# Patient Record
Sex: Female | Born: 1935 | Hispanic: No | Marital: Single | Smoking: Never smoker
Health system: Southern US, Community
[De-identification: ages and names within clinical notes are randomized; demographics above are authoritative.]

## PROBLEM LIST (undated history)

## (undated) DIAGNOSIS — J449 Chronic obstructive pulmonary disease, unspecified: Secondary | ICD-10-CM

## (undated) DIAGNOSIS — I10 Essential (primary) hypertension: Secondary | ICD-10-CM

## (undated) DIAGNOSIS — F0390 Unspecified dementia without behavioral disturbance: Secondary | ICD-10-CM

## (undated) DIAGNOSIS — F0150 Vascular dementia without behavioral disturbance: Secondary | ICD-10-CM

## (undated) HISTORY — DX: Essential (primary) hypertension: I10

## (undated) HISTORY — DX: Chronic obstructive pulmonary disease, unspecified: J44.9

## (undated) HISTORY — PX: TOOTH EXTRACTION: SUR596

---

## 2002-12-10 ENCOUNTER — Other Ambulatory Visit: Admission: RE | Admit: 2002-12-10 | Discharge: 2002-12-10 | Payer: Self-pay | Admitting: Unknown Physician Specialty

## 2019-05-29 DIAGNOSIS — E559 Vitamin D deficiency, unspecified: Secondary | ICD-10-CM | POA: Diagnosis not present

## 2019-05-29 DIAGNOSIS — Z0001 Encounter for general adult medical examination with abnormal findings: Secondary | ICD-10-CM | POA: Diagnosis not present

## 2019-05-29 DIAGNOSIS — E782 Mixed hyperlipidemia: Secondary | ICD-10-CM | POA: Diagnosis not present

## 2019-05-29 DIAGNOSIS — M316 Other giant cell arteritis: Secondary | ICD-10-CM | POA: Diagnosis not present

## 2019-05-29 DIAGNOSIS — I10 Essential (primary) hypertension: Secondary | ICD-10-CM | POA: Diagnosis not present

## 2019-05-29 DIAGNOSIS — J4530 Mild persistent asthma, uncomplicated: Secondary | ICD-10-CM | POA: Diagnosis not present

## 2019-06-27 DIAGNOSIS — Z66 Do not resuscitate: Secondary | ICD-10-CM | POA: Diagnosis not present

## 2019-10-15 DIAGNOSIS — M2041 Other hammer toe(s) (acquired), right foot: Secondary | ICD-10-CM | POA: Diagnosis not present

## 2019-10-15 DIAGNOSIS — B351 Tinea unguium: Secondary | ICD-10-CM | POA: Diagnosis not present

## 2019-10-15 DIAGNOSIS — I739 Peripheral vascular disease, unspecified: Secondary | ICD-10-CM | POA: Diagnosis not present

## 2019-10-15 DIAGNOSIS — L608 Other nail disorders: Secondary | ICD-10-CM | POA: Diagnosis not present

## 2019-11-18 DIAGNOSIS — E782 Mixed hyperlipidemia: Secondary | ICD-10-CM | POA: Diagnosis not present

## 2019-11-18 DIAGNOSIS — H5450 Low vision, one eye, unspecified eye: Secondary | ICD-10-CM | POA: Diagnosis not present

## 2019-11-18 DIAGNOSIS — Z0001 Encounter for general adult medical examination with abnormal findings: Secondary | ICD-10-CM | POA: Diagnosis not present

## 2019-11-18 DIAGNOSIS — M316 Other giant cell arteritis: Secondary | ICD-10-CM | POA: Diagnosis not present

## 2019-11-18 DIAGNOSIS — N390 Urinary tract infection, site not specified: Secondary | ICD-10-CM | POA: Diagnosis not present

## 2019-11-18 DIAGNOSIS — Z0189 Encounter for other specified special examinations: Secondary | ICD-10-CM | POA: Diagnosis not present

## 2019-11-27 DIAGNOSIS — E782 Mixed hyperlipidemia: Secondary | ICD-10-CM | POA: Diagnosis not present

## 2019-11-27 DIAGNOSIS — E559 Vitamin D deficiency, unspecified: Secondary | ICD-10-CM | POA: Diagnosis not present

## 2019-11-27 DIAGNOSIS — Z0001 Encounter for general adult medical examination with abnormal findings: Secondary | ICD-10-CM | POA: Diagnosis not present

## 2019-11-27 DIAGNOSIS — Z0189 Encounter for other specified special examinations: Secondary | ICD-10-CM | POA: Diagnosis not present

## 2019-11-27 DIAGNOSIS — H5450 Low vision, one eye, unspecified eye: Secondary | ICD-10-CM | POA: Diagnosis not present

## 2019-11-27 DIAGNOSIS — N390 Urinary tract infection, site not specified: Secondary | ICD-10-CM | POA: Diagnosis not present

## 2019-12-04 DIAGNOSIS — I10 Essential (primary) hypertension: Secondary | ICD-10-CM | POA: Diagnosis not present

## 2019-12-04 DIAGNOSIS — E782 Mixed hyperlipidemia: Secondary | ICD-10-CM | POA: Diagnosis not present

## 2019-12-04 DIAGNOSIS — E559 Vitamin D deficiency, unspecified: Secondary | ICD-10-CM | POA: Diagnosis not present

## 2019-12-04 DIAGNOSIS — Z23 Encounter for immunization: Secondary | ICD-10-CM | POA: Diagnosis not present

## 2019-12-04 DIAGNOSIS — N390 Urinary tract infection, site not specified: Secondary | ICD-10-CM | POA: Diagnosis not present

## 2019-12-18 DIAGNOSIS — E559 Vitamin D deficiency, unspecified: Secondary | ICD-10-CM | POA: Diagnosis not present

## 2019-12-18 DIAGNOSIS — M316 Other giant cell arteritis: Secondary | ICD-10-CM | POA: Diagnosis not present

## 2019-12-18 DIAGNOSIS — I10 Essential (primary) hypertension: Secondary | ICD-10-CM | POA: Diagnosis not present

## 2020-02-05 DIAGNOSIS — I739 Peripheral vascular disease, unspecified: Secondary | ICD-10-CM | POA: Diagnosis not present

## 2020-02-05 DIAGNOSIS — B351 Tinea unguium: Secondary | ICD-10-CM | POA: Diagnosis not present

## 2020-02-05 DIAGNOSIS — M2041 Other hammer toe(s) (acquired), right foot: Secondary | ICD-10-CM | POA: Diagnosis not present

## 2020-05-27 DIAGNOSIS — N390 Urinary tract infection, site not specified: Secondary | ICD-10-CM | POA: Diagnosis not present

## 2020-05-27 DIAGNOSIS — Z0001 Encounter for general adult medical examination with abnormal findings: Secondary | ICD-10-CM | POA: Diagnosis not present

## 2020-05-27 DIAGNOSIS — E782 Mixed hyperlipidemia: Secondary | ICD-10-CM | POA: Diagnosis not present

## 2020-05-27 DIAGNOSIS — H5450 Low vision, one eye, unspecified eye: Secondary | ICD-10-CM | POA: Diagnosis not present

## 2020-05-27 DIAGNOSIS — Z0189 Encounter for other specified special examinations: Secondary | ICD-10-CM | POA: Diagnosis not present

## 2020-06-08 DIAGNOSIS — H5452A1 Low vision left eye category 1, normal vision right eye: Secondary | ICD-10-CM | POA: Diagnosis not present

## 2020-06-08 DIAGNOSIS — G3184 Mild cognitive impairment, so stated: Secondary | ICD-10-CM | POA: Diagnosis not present

## 2020-06-08 DIAGNOSIS — R269 Unspecified abnormalities of gait and mobility: Secondary | ICD-10-CM | POA: Diagnosis not present

## 2020-06-08 DIAGNOSIS — J4530 Mild persistent asthma, uncomplicated: Secondary | ICD-10-CM | POA: Diagnosis not present

## 2020-06-08 DIAGNOSIS — M316 Other giant cell arteritis: Secondary | ICD-10-CM | POA: Diagnosis not present

## 2020-06-08 DIAGNOSIS — E559 Vitamin D deficiency, unspecified: Secondary | ICD-10-CM | POA: Diagnosis not present

## 2020-06-08 DIAGNOSIS — E782 Mixed hyperlipidemia: Secondary | ICD-10-CM | POA: Diagnosis not present

## 2020-06-08 DIAGNOSIS — I10 Essential (primary) hypertension: Secondary | ICD-10-CM | POA: Diagnosis not present

## 2020-07-03 ENCOUNTER — Encounter (HOSPITAL_COMMUNITY): Payer: Self-pay | Admitting: *Deleted

## 2020-07-03 ENCOUNTER — Emergency Department (HOSPITAL_COMMUNITY): Payer: Medicare Other

## 2020-07-03 ENCOUNTER — Emergency Department (HOSPITAL_COMMUNITY)
Admission: EM | Admit: 2020-07-03 | Discharge: 2020-07-03 | Disposition: A | Discharge status: Home / Self Care | Payer: Medicare Other | Attending: Emergency Medicine | Admitting: Emergency Medicine

## 2020-07-03 ENCOUNTER — Other Ambulatory Visit: Payer: Self-pay

## 2020-07-03 DIAGNOSIS — R0981 Nasal congestion: Secondary | ICD-10-CM | POA: Diagnosis present

## 2020-07-03 DIAGNOSIS — Z20822 Contact with and (suspected) exposure to covid-19: Secondary | ICD-10-CM | POA: Insufficient documentation

## 2020-07-03 DIAGNOSIS — R319 Hematuria, unspecified: Secondary | ICD-10-CM | POA: Diagnosis not present

## 2020-07-03 DIAGNOSIS — R531 Weakness: Secondary | ICD-10-CM | POA: Diagnosis not present

## 2020-07-03 DIAGNOSIS — R9431 Abnormal electrocardiogram [ECG] [EKG]: Secondary | ICD-10-CM | POA: Diagnosis not present

## 2020-07-03 DIAGNOSIS — J069 Acute upper respiratory infection, unspecified: Secondary | ICD-10-CM | POA: Diagnosis not present

## 2020-07-03 DIAGNOSIS — F0390 Unspecified dementia without behavioral disturbance: Secondary | ICD-10-CM | POA: Diagnosis not present

## 2020-07-03 DIAGNOSIS — B9789 Other viral agents as the cause of diseases classified elsewhere: Secondary | ICD-10-CM | POA: Diagnosis not present

## 2020-07-03 HISTORY — DX: Unspecified dementia, unspecified severity, without behavioral disturbance, psychotic disturbance, mood disturbance, and anxiety: F03.90

## 2020-07-03 LAB — URINALYSIS, ROUTINE W REFLEX MICROSCOPIC
Bacteria, UA: NONE SEEN
Bilirubin Urine: NEGATIVE
Glucose, UA: NEGATIVE mg/dL
Ketones, ur: 5 mg/dL — AB
Leukocytes,Ua: NEGATIVE
Nitrite: NEGATIVE
Protein, ur: NEGATIVE mg/dL
Specific Gravity, Urine: 1.021 (ref 1.005–1.030)
pH: 5 (ref 5.0–8.0)

## 2020-07-03 LAB — CBC WITH DIFFERENTIAL/PLATELET
Abs Immature Granulocytes: 0.13 10*3/uL — ABNORMAL HIGH (ref 0.00–0.07)
Basophils Absolute: 0 10*3/uL (ref 0.0–0.1)
Basophils Relative: 0 %
Eosinophils Absolute: 0 10*3/uL (ref 0.0–0.5)
Eosinophils Relative: 0 %
HCT: 41.5 % (ref 36.0–46.0)
Hemoglobin: 13.3 g/dL (ref 12.0–15.0)
Immature Granulocytes: 1 %
Lymphocytes Relative: 5 %
Lymphs Abs: 0.6 10*3/uL — ABNORMAL LOW (ref 0.7–4.0)
MCH: 30.9 pg (ref 26.0–34.0)
MCHC: 32 g/dL (ref 30.0–36.0)
MCV: 96.5 fL (ref 80.0–100.0)
Monocytes Absolute: 0.7 10*3/uL (ref 0.1–1.0)
Monocytes Relative: 6 %
Neutro Abs: 10.3 10*3/uL — ABNORMAL HIGH (ref 1.7–7.7)
Neutrophils Relative %: 88 %
Platelets: 237 10*3/uL (ref 150–400)
RBC: 4.3 MIL/uL (ref 3.87–5.11)
RDW: 14 % (ref 11.5–15.5)
WBC: 11.8 10*3/uL — ABNORMAL HIGH (ref 4.0–10.5)
nRBC: 0 % (ref 0.0–0.2)

## 2020-07-03 LAB — RESP PANEL BY RT-PCR (FLU A&B, COVID) ARPGX2
Influenza A by PCR: NEGATIVE
Influenza B by PCR: NEGATIVE
SARS Coronavirus 2 by RT PCR: NEGATIVE

## 2020-07-03 LAB — COMPREHENSIVE METABOLIC PANEL
ALT: 12 U/L (ref 0–44)
AST: 19 U/L (ref 15–41)
Albumin: 3.8 g/dL (ref 3.5–5.0)
Alkaline Phosphatase: 81 U/L (ref 38–126)
Anion gap: 10 (ref 5–15)
BUN: 25 mg/dL — ABNORMAL HIGH (ref 8–23)
CO2: 22 mmol/L (ref 22–32)
Calcium: 9 mg/dL (ref 8.9–10.3)
Chloride: 107 mmol/L (ref 98–111)
Creatinine, Ser: 0.88 mg/dL (ref 0.44–1.00)
GFR, Estimated: >60 mL/min (ref >60)
Glucose, Bld: 134 mg/dL — ABNORMAL HIGH (ref 70–99)
Potassium: 3.8 mmol/L (ref 3.5–5.1)
Sodium: 139 mmol/L (ref 135–145)
Total Bilirubin: 0.8 mg/dL (ref 0.3–1.2)
Total Protein: 7.3 g/dL (ref 6.5–8.1)

## 2020-07-03 MED ORDER — ACETAMINOPHEN 325 MG PO TABS
650.0000 mg | ORAL_TABLET | Freq: Once | ORAL | Status: AC
  Administered 2020-07-03: 650 mg via ORAL
  Filled 2020-07-03: qty 2

## 2020-07-03 NOTE — Discharge Instructions (Addendum)
Your work-up today was overall reassuring. Your urine did show some blood in it, please follow up with your primary care doctor to have this rechecked. However, this is not contributing to your symptoms. Your Covid test was negative, chest x-ray did not show any pneumonia.  You likely are experiencing an upper respiratory infection.  Continue to take Tylenol for body aches, continue to drink plenty of fluids and you may take over-the-counter cold and flu medicines.  Please return to the ER for any new or worsening symptoms.

## 2020-07-03 NOTE — ED Triage Notes (Signed)
C/o HA and general body pain since 0230 this morning, runny nose as well.  Denies any covid exposure and took home test and was negative. Generalized weakness as well.  Took asa and OTC congestion med  earlier today.

## 2020-07-03 NOTE — ED Notes (Signed)
Pt assisted to and from bathroom. Attempted to get urine specimen, contaminated by stool. Pt back to bed, hygiene care performed. Purewick in place. Pt and family aware of need for urine specimen. Updated PA.

## 2020-07-03 NOTE — ED Provider Notes (Addendum)
[facility] EMERGENCY DEPARTMENT Provider Note   CSN: [id number] Arrival date & time: 07/03/20  1851     History Chief Complaint  Patient presents with  . Headache    Ruth Pierce is a 87 y.o. female.  HPI 87 year old female with a history of dementia presents to the ER with complaints of body aches, nasal congestion, dry cough since around 2:30 AM this morning.  Daughter at bedside reports that the patient woke her up at 2:30 AM and stated that she needed some tea and had nasal congestion.  She has had continued to have some body aches throughout the day with a dry cough.  Denies any nausea or vomiting.  No known fevers or chills.  No chest pain, no shortness of breath.  No sore throat.  She is vaccinated and boosted for Covid and has also had the flu shot.  She states she also has a headache, but no blurry vision, no facial droop, no word slurring.  No abdominal pain, dysuria or hematuria.  No other sick contacts at home.  She has been taking aspirin and Mucinex at home.     Past Medical History:  Diagnosis Date  . Dementia (HCC)     There are no problems to display for this patient.   Past Surgical History:  Procedure Laterality Date  . TOOTH EXTRACTION       OB History   No obstetric history on file.     History reviewed. No pertinent family history.  Social History   Tobacco Use  . Smoking status: Never Smoker  . Smokeless tobacco: Never Used    Home Medications Prior to Admission medications   Medication Sig Start Date End Date Taking? Authorizing Provider  albuterol (VENTOLIN HFA) 108 (90 Base) MCG/ACT inhaler Inhale 1-2 puffs into the lungs every 4 (four) hours as needed for wheezing or shortness of breath.   Yes [provider]  losartan (COZAAR) 50 MG tablet Take 1 tablet by mouth daily. 05/02/20  Yes [provider]  Vitamin D, Ergocalciferol, (DRISDOL) 1.25 MG (50000 UNIT) CAPS capsule Take 50,000 Units by mouth every 7 (seven) days.  05/02/20  Yes [provider]    Allergies    Patient has no known allergies.  Review of Systems   Review of Systems  Constitutional: Positive for activity change, appetite change and fatigue. Negative for chills and fever.  HENT: Positive for congestion. Negative for ear pain and sore throat.   Eyes: Negative for pain and visual disturbance.  Respiratory: Positive for cough. Negative for shortness of breath.   Cardiovascular: Negative for chest pain and palpitations.  Gastrointestinal: Negative for abdominal pain, diarrhea, nausea and vomiting.  Genitourinary: Negative for dysuria and hematuria.  Musculoskeletal: Negative for arthralgias and back pain.  Skin: Negative for color change and rash.  Neurological: Positive for weakness. Negative for seizures and syncope.  All other systems reviewed and are negative.   Physical Exam Updated Vital Signs BP (!) 179/86 (BP Location: Left Arm)   Pulse 84   Temp 98.3 F (36.8 C) (Oral)   Resp 20   Ht 5' (1.524 m)   Wt 46.3 kg   SpO2 95%   BMI 19.92 kg/m   Physical Exam Vitals and nursing note reviewed.  Constitutional:      General: She is not in acute distress.    Appearance: She is well-developed.  HENT:     Head: Normocephalic and atraumatic.  Eyes:     General: No  visual field deficit.    Conjunctiva/sclera: Conjunctivae normal.  Cardiovascular:     Rate and Rhythm: Normal rate and regular rhythm.     Heart sounds: No murmur heard.   Pulmonary:     Effort: Pulmonary effort is normal. No respiratory distress.     Breath sounds: Normal breath sounds.  Abdominal:     Palpations: Abdomen is soft.     Tenderness: There is no abdominal tenderness.  Musculoskeletal:     Cervical back: Neck supple.  Skin:    General: Skin is warm and dry.  Neurological:     Mental Status: She is alert and oriented to person, place, and time.     Cranial Nerves: No cranial nerve deficit, dysarthria or facial asymmetry.      Sensory: No sensory deficit.     Deep Tendon Reflexes: Reflexes normal.     Comments: Mental Status:  Alert, thought content appropriate, able to give a coherent history. Speech fluent without evidence of aphasia. Able to follow 2 step commands without difficulty.  Cranial Nerves:  II: Peripheral visual fields grossly normal, pupils equal, round, reactive to light III,IV, VI: ptosis not present, extra-ocular motions intact bilaterally  V,VII: smile symmetric, facial light touch sensation equal VIII: hearing grossly normal to voice  X: uvula elevates symmetrically  XI: bilateral shoulder shrug symmetric and strong XII: midline tongue extension without fassiculations Motor:  Normal tone. 5/5 strength of BUE and BLE major muscle groups including strong and equal grip strength and dorsiflexion/plantar flexion Sensory: light touch normal in all extremities. Cerebellar: normal finger-to-nose with bilateral upper extremities, Romberg sign absent Gait: Not accessed    Psychiatric:        Mood and Affect: Mood is not depressed.        Behavior: Behavior is not agitated.     ED Results / Procedures / Treatments   Labs (all labs ordered are listed, but only abnormal results are displayed) Labs Reviewed  CBC WITH DIFFERENTIAL/PLATELET - Abnormal; Notable for the following components:      Result Value   WBC 11.8 (*)    Neutro Abs 10.3 (*)    Lymphs Abs 0.6 (*)    Abs Immature Granulocytes 0.13 (*)    All other components within normal limits  COMPREHENSIVE METABOLIC PANEL - Abnormal; Notable for the following components:   Glucose, Bld 134 (*)    BUN 25 (*)    All other components within normal limits  URINALYSIS, ROUTINE W REFLEX MICROSCOPIC - Abnormal; Notable for the following components:   APPearance HAZY (*)    Hgb urine dipstick MODERATE (*)    Ketones, ur 5 (*)    All other components within normal limits  RESP PANEL BY RT-PCR (FLU A&B, COVID) ARPGX2     EKG None  Radiology DG Chest Portable 1 View  Result Date: 07/03/2020 CLINICAL DATA:  Weakness, body aches EXAM: PORTABLE CHEST 1 VIEW COMPARISON:  None. FINDINGS: Mild hyperinflation. Heart is normal size. Aortic calcifications. Lungs clear. No effusions or acute bony abnormality. IMPRESSION: No active disease. Electronically Signed   By: Charlett Nose M.D.   On: 07/03/2020 20:07    Procedures Procedures   Medications Ordered in ED Medications  acetaminophen (TYLENOL) tablet 650 mg (650 mg Oral Given 07/03/20 1926)    ED Course  I have reviewed the triage vital signs and the nursing notes.  Pertinent labs & imaging results that were available during my care of the patient were reviewed by me and  considered in my medical decision making (see chart for details).    MDM Rules/Calculators/A&P                          87 year old female with myalgias, nasal congestion, cough which began this morning.  On arrival, she is well-appearing, speaking full sentences without increased work of breathing.  Lung sounds are clear.  Abdomen soft and nontender.  Vitals overall reassuring, though her blood pressure was elevated here at 179/86.  She has no chest pain or shortness of breath.  Low suspicion for hypertensive urgency/emergency.  His daughter states that she did not take her blood pressure medicines today.  Her work-up here was overall reassuring, CBC with a mild leukocytosis of 11.8, no other significant abnormalities noted.  CMP without any significant electrode abnormalities, normal renal function and liver function test.  Covid and flu are negative.  Chest x-ray without evidence of pneumonia.  UA with moderate amount hemoglobin, some ketones, patient was informed of these findings and was told to follow-up with PCP to have this rechecked.  We discussed return precautions.  She and her daughter at bedside voiced understanding and are agreeable.  Stable for discharge.  This was a shared  visit with my supervising physician Dr.Zammit who independently saw and evaluated the patient & provided guidance in evaluation/management/disposition ,in agreement with care  Final Clinical Impression(s) / ED Diagnoses Final diagnoses:  Viral URI  Hematuria, unspecified type    Rx / DC Orders ED Discharge Orders    None       Leone Brand 07/03/20 2255    Bethann Berkshire, MD 07/05/20 0909    Mare Ferrari, PA-C 07/12/20 1706    Bethann Berkshire, MD 07/12/20 2254

## 2020-07-08 DIAGNOSIS — I10 Essential (primary) hypertension: Secondary | ICD-10-CM | POA: Diagnosis not present

## 2020-07-08 DIAGNOSIS — R319 Hematuria, unspecified: Secondary | ICD-10-CM | POA: Diagnosis not present

## 2020-07-08 DIAGNOSIS — J069 Acute upper respiratory infection, unspecified: Secondary | ICD-10-CM | POA: Diagnosis not present

## 2020-07-14 ENCOUNTER — Other Ambulatory Visit: Payer: Self-pay | Admitting: *Deleted

## 2020-07-14 DIAGNOSIS — I10 Essential (primary) hypertension: Secondary | ICD-10-CM | POA: Insufficient documentation

## 2020-07-14 DIAGNOSIS — E559 Vitamin D deficiency, unspecified: Secondary | ICD-10-CM | POA: Insufficient documentation

## 2020-07-14 DIAGNOSIS — F0150 Vascular dementia without behavioral disturbance: Secondary | ICD-10-CM | POA: Insufficient documentation

## 2020-07-14 DIAGNOSIS — R519 Headache, unspecified: Secondary | ICD-10-CM | POA: Insufficient documentation

## 2020-07-14 DIAGNOSIS — M316 Other giant cell arteritis: Secondary | ICD-10-CM | POA: Insufficient documentation

## 2020-07-14 NOTE — Patient Outreach (Signed)
[facility]) Care Management  07/14/2020  TEMIA DEBROUX Mar 29, 1926 [id number]  Initial telephone outreach for care management.   Mrs. Kass went to the ED on Sunday due to increased respiratory sxs that were mild to moderate, body aches, HA. Her daughter reports she never asks to go to the hospital so when she did she thought it was better safe than sorry. She agreed to provide the following information and gave permission for me to review her chart:  Pt has hx of dementia, HTN, Osteoporosis, ?COPD or other respiratory dx, frequent UTIs  Outpatient Encounter Medications as of 07/14/2020  Medication Sig  . albuterol (VENTOLIN HFA) 108 (90 Base) MCG/ACT inhaler Inhale 1-2 puffs into the lungs every 4 (four) hours as needed for wheezing or shortness of breath.  . losartan (COZAAR) 50 MG tablet Take 1 tablet by mouth daily.  . Vitamin D, Ergocalciferol, (DRISDOL) 1.25 MG (50000 UNIT) CAPS capsule Take 50,000 Units by mouth every 7 (seven) days.   No facility-administered encounter medications on file as of 07/14/2020.   Dr.Hall make an order for DNR on pt record on 06/27/19.  Mrs. Etheleen Mayhew advised they have applied for CAP services and they are waiting for someone from [place] to come out and assess their needs. She reports her mother goes to the [facility] once a week. She wishes she would go everyday and she still works full time. She is also aware of the PACE program which she didn't feel like would be a solution at this time.  Sending successful letter outreach. "When to go to the ED" Have provided my name and contact information.  Will call again in 2 weeks and have invited pt daughter to call me in the meantime if she has questions.  [name]. [name], MSN, [facility] Gerontological Nurse Practitioner [facility] Care Management [phone]

## 2020-07-15 ENCOUNTER — Other Ambulatory Visit: Payer: Self-pay | Admitting: *Deleted

## 2020-07-15 NOTE — Patient Outreach (Signed)
[facility] Customer service manager [facility]) Care Management  07/15/2020  PRAGYA LOFASO 02/21/1927 [id number]  Mrs. Ruth Pierce called and left an early message reporting her mother was having some scary SOB but she did not want to go to the hospital. She utilized the nurse call line and had a call back and was able to talk to the nurse who was able to help her through this episode. She expresses great gratitude for that service. She asked if I would call and she and her mother would like to enroll in our care management services. She reports her mother felt well enough to go to the [facility] today.  Called and requested a return call when she has an opportunity. Will propose a home visit next week.  [name]. [name], MSN, [facility] Gerontological Nurse Practitioner [facility] Care Management [phone]

## 2020-07-18 ENCOUNTER — Encounter (HOSPITAL_COMMUNITY): Payer: Self-pay | Admitting: Emergency Medicine

## 2020-07-18 ENCOUNTER — Emergency Department (HOSPITAL_COMMUNITY): Payer: Medicare Other

## 2020-07-18 ENCOUNTER — Other Ambulatory Visit: Payer: Self-pay | Admitting: *Deleted

## 2020-07-18 ENCOUNTER — Emergency Department (HOSPITAL_COMMUNITY)
Admission: EM | Admit: 2020-07-18 | Discharge: 2020-07-18 | Disposition: A | Discharge status: Home / Self Care | Payer: Medicare Other | Attending: Emergency Medicine | Admitting: Emergency Medicine

## 2020-07-18 ENCOUNTER — Other Ambulatory Visit: Payer: Self-pay

## 2020-07-18 DIAGNOSIS — R0981 Nasal congestion: Secondary | ICD-10-CM | POA: Diagnosis not present

## 2020-07-18 DIAGNOSIS — I10 Essential (primary) hypertension: Secondary | ICD-10-CM | POA: Insufficient documentation

## 2020-07-18 DIAGNOSIS — R0602 Shortness of breath: Secondary | ICD-10-CM | POA: Diagnosis not present

## 2020-07-18 DIAGNOSIS — F0390 Unspecified dementia without behavioral disturbance: Secondary | ICD-10-CM | POA: Insufficient documentation

## 2020-07-18 DIAGNOSIS — I491 Atrial premature depolarization: Secondary | ICD-10-CM | POA: Diagnosis not present

## 2020-07-18 DIAGNOSIS — R059 Cough, unspecified: Secondary | ICD-10-CM | POA: Diagnosis not present

## 2020-07-18 DIAGNOSIS — Z20822 Contact with and (suspected) exposure to covid-19: Secondary | ICD-10-CM | POA: Diagnosis not present

## 2020-07-18 DIAGNOSIS — J449 Chronic obstructive pulmonary disease, unspecified: Secondary | ICD-10-CM | POA: Diagnosis not present

## 2020-07-18 LAB — COMPREHENSIVE METABOLIC PANEL
ALT: 10 U/L (ref 0–44)
AST: 16 U/L (ref 15–41)
Albumin: 3.3 g/dL — ABNORMAL LOW (ref 3.5–5.0)
Alkaline Phosphatase: 61 U/L (ref 38–126)
Anion gap: 8 (ref 5–15)
BUN: 22 mg/dL (ref 8–23)
CO2: 24 mmol/L (ref 22–32)
Calcium: 8.7 mg/dL — ABNORMAL LOW (ref 8.9–10.3)
Chloride: 105 mmol/L (ref 98–111)
Creatinine, Ser: 0.77 mg/dL (ref 0.44–1.00)
GFR, Estimated: >60 mL/min (ref >60)
Glucose, Bld: 97 mg/dL (ref 70–99)
Potassium: 4.3 mmol/L (ref 3.5–5.1)
Sodium: 137 mmol/L (ref 135–145)
Total Bilirubin: 0.8 mg/dL (ref 0.3–1.2)
Total Protein: 6.6 g/dL (ref 6.5–8.1)

## 2020-07-18 LAB — CBC WITH DIFFERENTIAL/PLATELET
Abs Immature Granulocytes: 0.02 10*3/uL (ref 0.00–0.07)
Basophils Absolute: 0 10*3/uL (ref 0.0–0.1)
Basophils Relative: 0 %
Eosinophils Absolute: 0.1 10*3/uL (ref 0.0–0.5)
Eosinophils Relative: 2 %
HCT: 38.1 % (ref 36.0–46.0)
Hemoglobin: 12 g/dL (ref 12.0–15.0)
Immature Granulocytes: 0 %
Lymphocytes Relative: 17 %
Lymphs Abs: 1.2 10*3/uL (ref 0.7–4.0)
MCH: 30.8 pg (ref 26.0–34.0)
MCHC: 31.5 g/dL (ref 30.0–36.0)
MCV: 97.9 fL (ref 80.0–100.0)
Monocytes Absolute: 0.7 10*3/uL (ref 0.1–1.0)
Monocytes Relative: 10 %
Neutro Abs: 4.8 10*3/uL (ref 1.7–7.7)
Neutrophils Relative %: 71 %
Platelets: 288 10*3/uL (ref 150–400)
RBC: 3.89 MIL/uL (ref 3.87–5.11)
RDW: 13.9 % (ref 11.5–15.5)
WBC: 6.9 10*3/uL (ref 4.0–10.5)
nRBC: 0 % (ref 0.0–0.2)

## 2020-07-18 LAB — RESP PANEL BY RT-PCR (FLU A&B, COVID) ARPGX2
Influenza A by PCR: NEGATIVE
Influenza B by PCR: NEGATIVE
SARS Coronavirus 2 by RT PCR: NEGATIVE

## 2020-07-18 LAB — TROPONIN I (HIGH SENSITIVITY): Troponin I (High Sensitivity): 17 ng/L (ref <18)

## 2020-07-18 LAB — BRAIN NATRIURETIC PEPTIDE: B Natriuretic Peptide: 28 pg/mL (ref 0.0–100.0)

## 2020-07-18 MED ORDER — ALBUTEROL SULFATE HFA 108 (90 BASE) MCG/ACT IN AERS: 1.0000 | INHALATION_SPRAY | RESPIRATORY_TRACT | 0 refills | Status: AC | PRN

## 2020-07-18 NOTE — ED Triage Notes (Signed)
Pt cough, congestion and sob x 2 weeks. Pt was seen here last week for the same. Pt was 92% on room air per ems

## 2020-07-18 NOTE — ED Provider Notes (Signed)
Emergency Department Provider Note   I have reviewed the triage vital signs and the nursing notes.   HISTORY  Chief Complaint Cough   HPI Ruth Pierce is a 87 y.o. female with PMH reviewed below presents to the ED with SOB and congestion. Symptoms have been waxing and waning for the last two weeks. She reports that she likes to walk and the symptoms do not limit that activity. She is not having CP, fever, or chills. She lives with her daughter. No new medications. Denies any swelling in the legs. She has not noticed an association with food but says she did cut back on eating bread because it makes her feel full but no pain.   Past Medical History:  Diagnosis Date  . Dementia [facility])     Patient Active Problem List   Diagnosis Date Noted  . Vascular dementia (HCC) 07/14/2020  . HTN (hypertension) 07/14/2020  . Vitamin D deficiency 07/14/2020  . Giant cell arteritis (HCC) 07/14/2020  . Headache 07/14/2020    Past Surgical History:  Procedure Laterality Date  . TOOTH EXTRACTION      Allergies Patient has no known allergies.  No family history on file.  Social History Social History   Tobacco Use  . Smoking status: Never Smoker  . Smokeless tobacco: Never Used    Review of Systems  Constitutional: No fever/chills Eyes: No visual changes. ENT: No sore throat. Cardiovascular: Denies chest pain. Respiratory: Positive cough, congestion, and SOB.  Gastrointestinal: No abdominal pain.  No nausea, no vomiting.  No diarrhea.  No constipation. Genitourinary: Negative for dysuria. Musculoskeletal: Negative for back pain. Skin: Negative for rash. Neurological: Negative for headaches, focal weakness or numbness.  10-point ROS otherwise negative.  ____________________________________________   PHYSICAL EXAM:  VITAL SIGNS: ED Triage Vitals  Enc Vitals Group     BP 07/18/20 1943 (!) 157/83     Pulse Rate 07/18/20 1943 79     Resp 07/18/20 1943 17     Temp  07/18/20 1943 (!) 97.4 F (36.3 C)     Temp Source 07/18/20 1943 Oral     SpO2 07/18/20 1943 93 %     Weight 07/18/20 1941 102 lb 1.2 oz (46.3 kg)     Height 07/18/20 1941 5' (1.524 m)   Constitutional: Alert and oriented. Well appearing and in no acute distress. Eyes: Conjunctivae are normal.  Head: Atraumatic. Nose: No congestion/rhinnorhea. Mouth/Throat: Mucous membranes are moist.  Neck: No stridor.  Cardiovascular: Normal rate, regular rhythm. Good peripheral circulation. Grossly normal heart sounds.   Respiratory: Normal respiratory effort.  No retractions. Lungs CTAB. Gastrointestinal: Soft and nontender. No distention.  Musculoskeletal: No lower extremity tenderness nor edema. No gross deformities of extremities. Neurologic:  Normal speech and language. No gross focal neurologic deficits are appreciated.  Skin:  Skin is warm, dry and intact. No rash noted.   ____________________________________________   LABS (all labs ordered are listed, but only abnormal results are displayed)  Labs Reviewed  COMPREHENSIVE METABOLIC PANEL - Abnormal; Notable for the following components:      Result Value   Calcium 8.7 (*)    Albumin 3.3 (*)    All other components within normal limits  RESP PANEL BY RT-PCR (FLU A&B, COVID) ARPGX2  BRAIN NATRIURETIC PEPTIDE  CBC WITH DIFFERENTIAL/PLATELET  TROPONIN I (HIGH SENSITIVITY)   ____________________________________________  EKG   EKG Interpretation  Date/Time:  Monday Jul 18 2020 19:47:31 EDT Ventricular Rate:  81 PR Interval:  155 QRS  Duration: 95 QT Interval:  406 QTC Calculation: 472 R Axis:   -64 Text Interpretation: Sinus rhythm Left anterior fascicular block Nonspecific T abnrm, anterolateral leads No significant change since last tracing Confirmed by Alona Bene [phone]) on 07/18/2020 8:45:16 PM       ____________________________________________  RADIOLOGY  DG Chest Portable 1 View  Result Date: 07/18/2020 CLINICAL  DATA:  Cough, congestion and shortness of breath. EXAM: PORTABLE CHEST 1 VIEW COMPARISON:  July 03, 2020 FINDINGS: The lungs are hyperinflated. Chronic appearing increased lung markings are seen without evidence of acute infiltrate, pleural effusion or pneumothorax. The heart size and mediastinal contours are within normal limits. Degenerative changes seen throughout the thoracic spine. IMPRESSION: COPD without acute or active cardiopulmonary disease. Electronically Signed   By: Aram Candela M.D.   On: 07/18/2020 20:40    ____________________________________________   PROCEDURES  Procedure(s) performed:   Procedures  None ____________________________________________   INITIAL IMPRESSION / ASSESSMENT AND PLAN / ED COURSE  Pertinent labs & imaging results that were available during my care of the patient were reviewed by me and considered in my medical decision making (see chart for details).   Patient with cough and congestion symptoms with intermittent SOB noted. No CP. Considered atypical ACS, PE, PNA, COPD exacerbation. Labs, CXR, COVID tests ordered. Normal BNP. COVID negative. Troponin negative. EKG interpreted by me as above. No hypoxemia. Discussed results with patient who is feeling well and needs albuterol at home. Discussed with daughter by phone regarding case and results. Plan for PCP follow up. Consider pulmonology referral PRN.    ____________________________________________  FINAL CLINICAL IMPRESSION(S) / ED DIAGNOSES  Final diagnoses:  SOB (shortness of breath)     Note:  This document was prepared using Dragon voice recognition software and may include unintentional dictation errors.  Alona Bene, MD, [facility] Emergency Medicine    Kyliegh Jester, Arlyss Repress, MD 07/19/20 [phone]

## 2020-07-18 NOTE — Discharge Instructions (Signed)
You were seen in the emerge department today with shortness of breath.  I am refilling your inhaler and will have you see Dr. Margo Aye tomorrow as scheduled.  If you develop chest pain or worsening shortness of breath please come back to the emergency department for reevaluation.  Your records are available in the MyChart app which you can download on your phone.  Information is provided in this discharge paperwork.

## 2020-07-18 NOTE — Patient Outreach (Signed)
[facility]) Care Management  07/18/2020  HEMA LANZA Jun 20, 1926 [id number]   [name], pt's daughter called and requested to enroll her mother. She asked if I could make a home visit on Tuesday afternnoon and then attend her mother's appt with her at Dr. Scharlene Gloss office.  This was possible to we scheduled to meet at their home and I would accompany them to the appt.  [name]. [name], MSN, [facility] Gerontological Nurse Practitioner [facility] Care Management [phone]

## 2020-07-19 ENCOUNTER — Encounter: Payer: Self-pay | Admitting: *Deleted

## 2020-07-19 ENCOUNTER — Other Ambulatory Visit: Payer: Self-pay | Admitting: *Deleted

## 2020-07-19 DIAGNOSIS — R052 Subacute cough: Secondary | ICD-10-CM | POA: Diagnosis not present

## 2020-07-19 DIAGNOSIS — J449 Chronic obstructive pulmonary disease, unspecified: Secondary | ICD-10-CM | POA: Diagnosis not present

## 2020-07-19 NOTE — Patient Outreach (Signed)
[facility]) Care Management  [facility] Care Manager  07/19/2020   Ruth Pierce Oct 25, 1926 [id number]  Subjective: Home visit for initiation of care management. Pt went to ED this am for SOB and cough. She is home but will now go to see Dr. Margo Aye.  Pt has productive cough white sputum for several weeks. She has anxiety with this which makes it worse.  Objective: BP (!) 100/50   Pulse 90   Resp 18   SpO2 92%    Encounter Medications:  Outpatient Encounter Medications as of 07/19/2020  Medication Sig  . albuterol (VENTOLIN HFA) 108 (90 Base) MCG/ACT inhaler Inhale 1-2 puffs into the lungs every 4 (four) hours as needed for wheezing or shortness of breath.  . losartan (COZAAR) 50 MG tablet Take 1 tablet by mouth daily.  . Vitamin D, Ergocalciferol, (DRISDOL) 1.25 MG (50000 UNIT) CAPS capsule Take 50,000 Units by mouth every 7 (seven) days.   No facility-administered encounter medications on file as of 07/19/2020.    Functional Status:  No flowsheet data found.  Fall/Depression Screening: Fall Risk  07/19/2020  Falls in the past year? 0  Number falls in past yr: 0  Injury with Fall? 0  Risk for fall due to : Impaired balance/gait;Medication side effect  Follow up Falls evaluation completed   PHQ 2/9 Scores 07/19/2020  PHQ - 2 Score 0    Assessment:  Goals Addressed            This Visit's Progress   . Enhance My Mental Skills       Timeframe:  Long-Range Goal Priority:  Medium Start Date:     07/19/20                        Expected End Date:  10/15/20                     Follow Up Date 08/24/20 - check out a senior citizen activity program - do word search or crossword puzzles daily - read 1 new book each month - take a walk daily and think about what I am seeing - write about my life story    Why is this important?    As we age, or sometimes because we have an illness, it feels like our memory and ability to figure things out is not very good.   There  are things you can do to keep your memory and your thinking as strong as possible.     Notes:     . Track and Manage My Blood Pressure-Hypertension       Timeframe:  Long-Range Goal Priority:  Medium Start Date:     07/19/20                        Expected End Date:     10/15/20                  Follow Up Date 09/15/20  - check blood pressure 3 times per week - write blood pressure results in a log or diary    Why is this important?    You won't feel high blood pressure, but it can still hurt your blood vessels.   High blood pressure can cause heart or kidney problems. It can also cause a stroke.   Making lifestyle changes like losing a little weight or eating less salt will help.  Checking your blood pressure at home and at different times of the day can help to control blood pressure.   If the doctor prescribes medicine remember to take it the way the doctor ordered.   Call the office if you cannot afford the medicine or if there are questions about it.     Notes: 07/19/20 Today's BP was 100/50       Plan: Daughter to give me an update tomorrow after seeing Dr. Margo Aye. Follow-up:  Patient agrees to Care Plan and Follow-up.   [name]. [name], MSN, [facility] Gerontological Nurse Practitioner [facility] Care Management [phone]

## 2020-07-20 ENCOUNTER — Other Ambulatory Visit: Payer: Self-pay | Admitting: *Deleted

## 2020-07-20 NOTE — Patient Outreach (Signed)
[facility]) Care Management  07/20/2020  KHORI UNDERBERG [birth year] [id number]   Telephone outreach to follow up on pt's primary care visit. Talked with pt's daughter, [name]. Pt was given a breathing tx in the office, referred for pulmonology consult, given new COPD control medication and ordered home nebs and Mucinex.  [name] says they had a relatively uneventful night although Ms. Brester did choke on the large mucinex pill and that was scary.  [name] says her husband is having surgery tomorrow and she is feeling a lot of pressure.  Suggested it would be beneficial for her to apply for family leave so she can attend to her family without fear of losing her job and taking some of the pressure off of herself.  She will call [employer] to see how the progress is going to get her mother in home assistance.  Advised she may get guaifenesin liquid in place of mucinex pills. Advised to get the plain NOT the DM formula.  We agreed to talk next Tuesday and reminded her she can call the nurse line for advice also.  [name]. [name], MSN, [facility] Gerontological Nurse Practitioner [facility] Care Management [phone]

## 2020-07-26 ENCOUNTER — Other Ambulatory Visit: Payer: Self-pay

## 2020-07-26 ENCOUNTER — Other Ambulatory Visit: Payer: Self-pay | Admitting: *Deleted

## 2020-07-27 ENCOUNTER — Ambulatory Visit: Payer: Medicare Other | Admitting: *Deleted

## 2020-07-28 NOTE — Patient Outreach (Signed)
[facility]) Care Management  [facility] Care Manager  07/28/2020   Ruth Pierce 11/16/1926 [id number]  Subjective: Telephone outreach to follow up on pt URI and caregiving arrangements progress.  Daughter voices being overwhelmed. She didn't anticipate that it would be so difficult to find care and be able to pay for it out of pocket which they cannot. Mother is on Medicaid. Daughter feels like LTC placement would be appropriate with her mother's demenita which will progress.  Encounter Medications:  Outpatient Encounter Medications as of 07/26/2020  Medication Sig  . albuterol (VENTOLIN HFA) 108 (90 Base) MCG/ACT inhaler Inhale 1-2 puffs into the lungs every 4 (four) hours as needed for wheezing or shortness of breath.  . losartan (COZAAR) 50 MG tablet Take 1 tablet by mouth daily.  . Vitamin D, Ergocalciferol, (DRISDOL) 1.25 MG (50000 UNIT) CAPS capsule Take 50,000 Units by mouth every 7 (seven) days.   No facility-administered encounter medications on file as of 07/26/2020.    Functional Status:  No flowsheet data found.  Fall/Depression Screening: Fall Risk  07/19/2020  Falls in the past year? 0  Number falls in past yr: 0  Injury with Fall? 0  Risk for fall due to : Impaired balance/gait;Medication side effect  Follow up Falls evaluation completed   PHQ 2/9 Scores 07/19/2020  PHQ - 2 Score 0    Assessment: Care Giver Burn Out  Goals Addressed            This Visit's Progress   . Client/Caregiver will visit LTC facility and meet with admissions coordinator and have a tour as reported by pt daughter over the next 2 weeks.       Start Date: 07/26/20 Priority: High Short term goal Follow up: 08/09/20  Barriers: Cultural Financial Knowledge Psychosocial  07/26/20 Provided Information, phone # and admissions coordinator name for local LTC (AL and Memory Care) appropriate pt. Counseled daughter on the difficult decision she feels she needs to make for the  family so she can continue to work and know her mother is safe and being well cared for. Pt is of latino descent and has bad impression of nursing facilities from her past. Encouraged daughter to talk frankly with her mother and to arrange a visit to tour and have a meal. Daughter agrees to do so.    . Enhance My Mental Skills       Timeframe:  Long-Range Goal Priority:  Medium Start Date:     07/19/20                        Expected End Date:  10/15/20                     Follow Up Date 08/24/20 - check out a senior citizen activity program - do word search or crossword puzzles daily - read 1 new book each month - take a walk daily and think about what I am seeing - write about my life story    Why is this important?    As we age, or sometimes because we have an illness, it feels like our memory and ability to figure things out is not very good.   There are things you can do to keep your memory and your thinking as strong as possible.    Notes: 07/26/20 Talked with pt's daughter today. Pt has not returned to the [facility] because she still has a cough. Priority at  present is deciding if having in home care for several hours a day vs LTC placement is best for the family. Will initiate new goal regarding this.    . Track and Manage My Blood Pressure-Hypertension       Timeframe:  Long-Range Goal Priority:  Medium Start Date:     07/19/20                        Expected End Date:     10/15/20                  Follow Up Date 09/15/20  - check blood pressure 3 times per week - write blood pressure results in a log or diary    Why is this important?    You won't feel high blood pressure, but it can still hurt your blood vessels.   High blood pressure can cause heart or kidney problems. It can also cause a stroke.   Making lifestyle changes like losing a little weight or eating less salt will help.   Checking your blood pressure at home and at different times of the day can help to control  blood pressure.   If the doctor prescribes medicine remember to take it the way the doctor ordered.   Call the office if you cannot afford the medicine or if there are questions about it.     Notes: 07/19/20 Today's BP was 100/50.       Plan:  [name] agrees to call me after her meeting at [facility]. If I don't hear from her, we agreed for a phone follow up in 2 weeks.  Follow-up:  Patient agrees to Care Plan and Follow-up.   [name]. [name], MSN, [facility] Gerontological Nurse Practitioner [facility] Care Management [phone]

## 2020-08-05 ENCOUNTER — Other Ambulatory Visit: Payer: Self-pay | Admitting: *Deleted

## 2020-08-05 ENCOUNTER — Other Ambulatory Visit: Payer: Self-pay

## 2020-08-09 NOTE — Patient Outreach (Signed)
[facility]) Care Management  [place]  08/09/2020   VALISHA HESLIN 1926-05-04 [id number]  Subjective: Telephone assessment for follow up progress on plans for LTC placement.  Encounter Medications:  Outpatient Encounter Medications as of 08/05/2020  Medication Sig  . albuterol (VENTOLIN HFA) 108 (90 Base) MCG/ACT inhaler Inhale 1-2 puffs into the lungs every 4 (four) hours as needed for wheezing or shortness of breath.  . losartan (COZAAR) 50 MG tablet Take 1 tablet by mouth daily.  . Vitamin D, Ergocalciferol, (DRISDOL) 1.25 MG (50000 UNIT) CAPS capsule Take 50,000 Units by mouth every 7 (seven) days.   No facility-administered encounter medications on file as of 08/05/2020.    Functional Status:  No flowsheet data found.  Fall/Depression Screening: Fall Risk  07/19/2020  Falls in the past year? 0  Number falls in past yr: 0  Injury with Fall? 0  Risk for fall due to : Impaired balance/gait;Medication side effect  Follow up Falls evaluation completed   PHQ 2/9 Scores 07/19/2020  PHQ - 2 Score 0    Assessment:  Goals Addressed            This Visit's Progress   . Client/Caregiver will visit LTC facility and meet with admissions coordinator and have a tour as reported by pt daughter over the next 2 weeks.   Not on track    Start Date: 07/26/20 Priority: High Short term goal Follow up: 08/09/20, Extending date to another 2 weeks: June 15th.  Barriers: Cultural Financial Knowledge Psychosocial  07/26/20 Provided Information, phone # and admissions coordinator name for local LTC (AL and Memory Care) appropriate pt. Counseled daughter on the difficult decision she feels she needs to make for the family so she can continue to work and know her mother is safe and being well cared for. Pt is of latino descent and has bad impression of nursing facilities from her past. Encouraged daughter to talk frankly with her mother and to arrange a visit to tour and  have a meal. Daughter agrees to do so. 08/07/21 Talked with Dossie Der who was in a high state of anxiety. She continues to try to move processes along quicker that they can be accomplished and this is very frustrating for her that needs cannot be met NOW. Reassured her she has done everything she can EXCEPT, they have not been over to [place] for a tour. Advised this is the most important thing to do now so that this can be part of the plan and to also show her mother that it is a nice place and is close to her daughter.    . Enhance My Mental Skills   On track    Timeframe:  Long-Range Goal Priority:  Medium Start Date:     07/19/20                        Expected End Date:  10/15/20                     Follow Up Date 08/24/20 - check out a senior citizen activity program - do word search or crossword puzzles daily - read 1 new book each month - take a walk daily and think about what I am seeing - write about my life story    Why is this important?    As we age, or sometimes because we have an illness, it feels like our memory and ability  to figure things out is not very good.   There are things you can do to keep your memory and your thinking as strong as possible.    Notes: 07/26/20 Talked with pt's daughter today. Pt has not returned to the [facility] because she still has a cough. Priority at present is deciding if having in home care for several hours a day vs LTC placement is best for the family. Will initiate new goal regarding this. 08/07/20 Pt is well [facility] which is a great relief to both Mr. And Mrs. Payton Mccallum as Mrs. Payton Mccallum is still working and Mr. Payton Mccallum is recovering from a knee surgery.    . Track and Manage My Blood Pressure-Hypertension       Timeframe:  Long-Range Goal Priority:  Medium Start Date:     07/19/20                        Expected End Date:     10/15/20                  Follow Up Date 09/15/20  - check blood pressure 3 times per week -  write blood pressure results in a log or diary    Why is this important?    You won't feel high blood pressure, but it can still hurt your blood vessels.   High blood pressure can cause heart or kidney problems. It can also cause a stroke.   Making lifestyle changes like losing a little weight or eating less salt will help.   Checking your blood pressure at home and at different times of the day can help to control blood pressure.   If the doctor prescribes medicine remember to take it the way the doctor ordered.   Call the office if you cannot afford the medicine or if there are questions about it.     Notes: 07/19/20 Today's BP was 100/50. 08/08/20 Unsure about today's BP daughter could not talk at length.       Plan: We agreed to talk again in 2 weeks. Follow-up:  Patient agrees to Care Plan and Follow-up.   Eulah Pont. Myrtie Neither, MSN, [facility] Gerontological Nurse Practitioner [facility] Care Management [phone]

## 2020-08-23 ENCOUNTER — Other Ambulatory Visit: Payer: Self-pay | Admitting: *Deleted

## 2020-08-23 NOTE — Patient Outreach (Signed)
[facility]) Care Management  [place]  08/23/2020   Ruth Pierce October 16, 1926 [id number]  Subjective: Follow up for COPD, falls, and living at home.  Encounter Medications:  Outpatient Encounter Medications as of 08/23/2020  Medication Sig  . albuterol (VENTOLIN HFA) 108 (90 Base) MCG/ACT inhaler Inhale 1-2 puffs into the lungs every 4 (four) hours as needed for wheezing or shortness of breath.  . losartan (COZAAR) 50 MG tablet Take 1 tablet by mouth daily.  . Vitamin D, Ergocalciferol, (DRISDOL) 1.25 MG (50000 UNIT) CAPS capsule Take 50,000 Units by mouth every 7 (seven) days.   No facility-administered encounter medications on file as of 08/23/2020.   Functional Status:  No flowsheet data found.  Fall/Depression Screening: Fall Risk  07/19/2020  Falls in the past year? 0  Number falls in past yr: 0  Injury with Fall? 0  Risk for fall due to : Impaired balance/gait;Medication side effect  Follow up Falls evaluation completed   PHQ 2/9 Scores 07/19/2020  PHQ - 2 Score 0   Assessment: Dementia                        High Risk for Falls                        HTN                        COPD  Care Plan  Goals Addressed            This Visit's Progress   . Client/Caregiver will visit LTC facility and meet with admissions coordinator and have a tour as reported by pt daughter over the next 2 weeks.   Not on track    Start Date: 07/26/20 Priority: High Short term goal Follow up: 08/09/20, Extending date to another 2 weeks: June 15th. Extending time for another month. If not completed by then will remove this goal.  Barriers: Cultural Financial Knowledge Psychosocial  07/26/20 Provided Information, phone # and admissions coordinator name for local LTC (AL and Memory Care) appropriate pt. Counseled daughter on the difficult decision she feels she needs to make for the family so she can continue to work and know her mother is safe and being well cared  for. Pt is of latino descent and has bad impression of nursing facilities from her past. Encouraged daughter to talk frankly with her mother and to arrange a visit to tour and have a meal. Daughter agrees to do so. 08/07/21 Talked with Dossie Der who was in a high state of anxiety. She continues to try to move processes along quicker that they can be accomplished and this is very frustrating for her that needs cannot be met NOW. Reassured her she has done everything she can EXCEPT, they have not been over to [place] for a tour. Advised this is the most important thing to do now so that this can be part of the plan and to also show her mother that it is a nice place and is close to her daughter. 08/23/20 Daughter reports she has been able to talk herself down from the high state of anxiety that she was in. The needs they have just aren't able to come together as quickly as she had hoped and there are still gaps and questions about who is doing what. She had to fill out the CAP application which was inadvertently  sent to the home address instead of the PO. She is signing up with [facility] Medicare Dual Eligible. [facility] is [facility] to [facility]. She does mention that she has never received a nebulizer and a spacer. NP to call about this. They have not been to visit [place].    . Enhance My Mental Skills as evidenced by being able to participate in conversations.   On track    Timeframe:  Long-Range Goal Priority:  Medium Start Date:     07/19/20                        Expected End Date:  10/15/20      Barriers: Health Behaviors                 Follow Up Date 08/24/20 - check out a senior citizen activity program - do word search or crossword puzzles daily - read 1 new book each month - take a walk daily and think about what I am seeing - write about my life story    Why is this important?    As we age, or sometimes because we have an illness, it feels like our memory and ability to figure things out  is not very good.   There are things you can do to keep your memory and your thinking as strong as possible.    Notes: 07/26/20 Talked with pt's daughter today. Pt has not returned to the [facility] because she still has a cough. Priority at present is deciding if having in home care for several hours a day vs LTC placement is best for the family. Will initiate new goal regarding this. 08/07/20 Pt is well enough to [facility] to [facility] which is a great relief to both Mr. And Mrs. Payton Mccallum as Mrs. Payton Mccallum is still working and Mr. Payton Mccallum is recovering from a knee surgery. 08/23/20. Mrs. Leonardo is able to go back to the [facility] a couple of days a week. She participates in many activities and is very stimulated.    . Track and Manage My Blood Pressure-Hypertension   Not on track    Timeframe:  Long-Range Goal Priority:  Medium Start Date:     07/19/20                        Expected End Date:     10/15/20                  Follow Up Date 09/15/20  - check blood pressure 3 times per week - write blood pressure results in a log or diary    Why is this important?    You won't feel high blood pressure, but it can still hurt your blood vessels.   High blood pressure can cause heart or kidney problems. It can also cause a stroke.   Making lifestyle changes like losing a little weight or eating less salt will help.   Checking your blood pressure at home and at different times of the day can help to control blood pressure.   If the doctor prescribes medicine remember to take it the way the doctor ordered.   Call the office if you cannot afford the medicine or if there are questions about it.     Notes: 07/19/20 Today's BP was 100/50. 08/08/20 Unsure about today's BP daughter could not talk at length. 08/23/20 Last knonw BP was  157/83 in the ED. They have fallen off their habit of checking her BP at home. Encouraged to resume home checks and records.       Plan: I will call to get her nebulizer and spacer.  Will call again in one month for follow up. Encouraged daughter to visit [place].  Follow-up: Patient agrees to Care Plan and Follow-up.   Eulah Pont. Myrtie Neither, MSN, [facility] Gerontological Nurse Practitioner [facility] Care Management [phone]

## 2020-09-01 DIAGNOSIS — J449 Chronic obstructive pulmonary disease, unspecified: Secondary | ICD-10-CM | POA: Diagnosis not present

## 2020-09-07 DIAGNOSIS — I10 Essential (primary) hypertension: Secondary | ICD-10-CM | POA: Diagnosis not present

## 2020-09-07 DIAGNOSIS — E559 Vitamin D deficiency, unspecified: Secondary | ICD-10-CM | POA: Diagnosis not present

## 2020-09-21 DIAGNOSIS — J45909 Unspecified asthma, uncomplicated: Secondary | ICD-10-CM | POA: Diagnosis not present

## 2020-09-21 DIAGNOSIS — I10 Essential (primary) hypertension: Secondary | ICD-10-CM | POA: Diagnosis not present

## 2020-09-21 DIAGNOSIS — H5452A1 Low vision left eye category 1, normal vision right eye: Secondary | ICD-10-CM | POA: Diagnosis not present

## 2020-09-21 DIAGNOSIS — E559 Vitamin D deficiency, unspecified: Secondary | ICD-10-CM | POA: Diagnosis not present

## 2020-09-21 DIAGNOSIS — E785 Hyperlipidemia, unspecified: Secondary | ICD-10-CM | POA: Diagnosis not present

## 2020-09-27 ENCOUNTER — Other Ambulatory Visit: Payer: Self-pay | Admitting: *Deleted

## 2020-09-27 NOTE — Patient Outreach (Signed)
[facility]) Care Management  09/27/2020  Ruth Pierce 12/19/1926 [id number]  Telephone outreach to follow up on pt getting PSC assistance or ALF placement. Left a message for pt daughter to return my call at her convenience.  [name]. [name], MSN, [place] Gerontological Nurse Practitioner [facility] Care Management [phone]

## 2020-09-28 ENCOUNTER — Other Ambulatory Visit: Payer: Self-pay | Admitting: *Deleted

## 2020-09-28 NOTE — Patient Outreach (Signed)
[facility]) Care Management  [place]  09/28/2020   JENNESSY SANDRIDGE 06-Feb-1927 [id number]  Subjective: Telephone outreach to follow up on pt placement vs in home care.  Encounter Medications:  Outpatient Encounter Medications as of 09/28/2020  Medication Sig   albuterol (VENTOLIN HFA) 108 (90 Base) MCG/ACT inhaler Inhale 1-2 puffs into the lungs every 4 (four) hours as needed for wheezing or shortness of breath.   losartan (COZAAR) 50 MG tablet Take 1 tablet by mouth daily.   Vitamin D, Ergocalciferol, (DRISDOL) 1.25 MG (50000 UNIT) CAPS capsule Take 50,000 Units by mouth every 7 (seven) days.   No facility-administered encounter medications on file as of 09/28/2020.    Fall/Depression Screening: Fall Risk  07/19/2020  Falls in the past year? 0  Number falls in past yr: 0  Injury with Fall? 0  Risk for fall due to : Impaired balance/gait;Medication side effect  Follow up Falls evaluation completed   PHQ 2/9 Scores 07/19/2020  PHQ - 2 Score 0    Assessment:  General health decline over last month, now getting palliative care services.                         PCS services pending   Care Plan   Goals Addressed             This Visit's Progress    COMPLETED: Client/Caregiver will visit LTC facility and meet with admissions coordinator and have a tour as reported by pt daughter over the next 2 weeks.       Start Date: 07/26/20 Priority: High Short term goal Follow up: 08/09/20, Extending date to another 2 weeks: June 15th. Extending time for another month. If not completed by then will remove this goal.  Barriers: Cultural Financial Knowledge Psychosocial  07/26/20 Provided Information, phone # and admissions coordinator name for local LTC (AL and Memory Care) appropriate pt. Counseled daughter on the difficult decision she feels she needs to make for the family so she can continue to work and know her mother is safe and being well cared for. Pt  is of latino descent and has bad impression of nursing facilities from her past. Encouraged daughter to talk frankly with her mother and to arrange a visit to tour and have a meal. Daughter agrees to do so. 08/07/21 Talked with Dossie Der who was in a high state of anxiety. She continues to try to move processes along quicker that they can be accomplished and this is very frustrating for her that needs cannot be met NOW. Reassured her she has done everything she can EXCEPT, they have not been over to [place] for a tour. Advised this is the most important thing to do now so that this can be part of the plan and to also show her mother that it is a nice place and is close to her daughter. 08/23/20 Daughter reports she has been able to talk herself down from the high state of anxiety that she was in. The needs they have just aren't able to come together as quickly as she had hoped and there are still gaps and questions about who is doing what. She had to fill out the CAP application which was inadvertently sent to the home address instead of the PO. She is signing up with [facility] Medicare Dual Eligible. [facility] is able to back to [facility]. She does mention that she has never received a nebulizer and  a spacer. NP to call about this. They have not been to visit [facility]. 09/28/20 Pt Daughter, Mrs. Payton Mccallum reports that they never did go to [place]. She has however gotten approval for PCS and needs to now go to DSS to get the service started. She reports her mother is now getting Palliative Care from [facility]. Her next home visit with them will be 10/10/20. Daughter is confused about what to do next if she should continue to seek in home care. Advised she needs to do so as Palliative Care will not provide the custodial care she and her mother need. She agrees to do so. The goal was not met.      Enhance My Mental Skills as evidenced by being able to participate in conversations.       Timeframe:   Long-Range Goal Priority:  Medium Start Date:     07/19/20                        Expected End Date:  10/15/20     Barriers: Health Behaviors                 Follow Up Date 11/15/20 Notes: 07/26/20 Talked with pt's daughter today. Pt has not returned to the [facility] because she still has a cough. Priority at present is deciding if having in home care for several hours a day vs LTC placement is best for the family. Will initiate new goal regarding this. 08/07/20 Pt is well enough to return to the [facility] which is a great relief to both Mr. And Mrs. Payton Mccallum as Mrs. Payton Mccallum is still working and Mr. Payton Mccallum is recovering from a knee surgery. 08/23/20. Mrs. Arenivas is able to go back to the [facility] a couple of days a week. She participates in many activities and is very stimulated. 09/28/20 Since last month pt has had a decline in general health, anorexia, apathy, not wanting to get out of bed. Daughter pushes fluids and has now just asked her mother what sounds good to eat and provided it for her. She hasn't gone to the [facility] in awhile and it is unknown if she will return at this point. Encouraged daughter to continually engage, talk with, and have her mother complete tasks for mental stimulation. At this time she still is participating in conversations.      COMPLETED: Track and Manage My Blood Pressure-Hypertension       Timeframe:  Long-Range Goal Priority:  Medium Start Date:     07/19/20                        Expected End Date:     10/15/20                  Follow Up Date 09/15/20  - check blood pressure 3 times per week - write blood pressure results in a log or diary    Why is this important?   You won't feel high blood pressure, but it can still hurt your blood vessels.  High blood pressure can cause heart or kidney problems. It can also cause a stroke.  Making lifestyle changes like losing a little weight or eating less salt will help.  Checking your blood pressure at home and at different  times of the day can help to control blood pressure.  If the doctor prescribes medicine remember to take it the way the  doctor ordered.  Call the office if you cannot afford the medicine or if there are questions about it.     Notes: 07/19/20 Today's BP was 100/50. 08/08/20 Unsure about today's BP daughter could not talk at length. 08/23/20 Last knonw BP was 157/83 in the ED. They have fallen off their habit of checking her BP at home. Encouraged to resume home checks and records. 09/28/20 Did not ask about BP monitoring today as daughter was talking about her mother's general decline and admission to palliative care.          Plan: Will check in with daughter in one month. She can call me if needed. Will close case next mo if fully engaged with Palliative Care and in home PCS arrangements in process. Follow-up: Follow-up in 1 month(s).  Eulah Pont. Myrtie Neither, MSN, [facility] Gerontological Nurse Practitioner [facility] Care Management [phone]

## 2020-10-01 DIAGNOSIS — J449 Chronic obstructive pulmonary disease, unspecified: Secondary | ICD-10-CM | POA: Diagnosis not present

## 2020-10-10 DIAGNOSIS — Z515 Encounter for palliative care: Secondary | ICD-10-CM | POA: Diagnosis not present

## 2020-11-01 DIAGNOSIS — J449 Chronic obstructive pulmonary disease, unspecified: Secondary | ICD-10-CM | POA: Diagnosis not present

## 2020-11-04 ENCOUNTER — Other Ambulatory Visit: Payer: Self-pay | Admitting: *Deleted

## 2020-11-04 NOTE — Patient Outreach (Signed)
[facility]) Care Management  11/04/2020  Ruth Pierce 05/09/1926 [id number]  Telephone outreach to follow up on pt disposition and other needs. No answer this am but was able to leave a message and request a return call from pt's daughter, [name].  [name]. [name], MSN, [facility] Gerontological Nurse Practitioner [facility] Care Management [phone]

## 2020-11-09 ENCOUNTER — Other Ambulatory Visit: Payer: Self-pay | Admitting: *Deleted

## 2020-11-09 NOTE — Patient Outreach (Signed)
[place] [facility]) Care Management  11/09/2020  Ruth Pierce 04-Apr-1926 [id number]  Telephone outreach, second attempt, left message to return my call.  Mrs. Ruth Mccallum, Ms. Ruth Pierce daughter returned my call. She reports her mother has improved dramatically. She is able to ambulate around the house. She is going to the [facility] on a regular basis. She has recently been to her primary care provider. Her birthday is TODAY - 87 years old. She is now involved with Palliative Care services. Mrs. Ruth Pierce feels all of her mother's and hers are being met. This NP to close the case.   Goals Addressed             This Visit's Progress    Enhance My Mental Skills as evidenced by being able to participate in conversations.       Timeframe:  Long-Range Goal Priority:  Medium Start Date:     07/19/20                        Expected End Date:  10/15/20     Barriers: Health Behaviors                 Follow Up Date 11/15/20 Notes: 07/26/20 Talked with pt's daughter today. Pt has not returned to the [facility] because she still has a cough. Priority at present is deciding if having in home care for several hours a day vs LTC placement is best for the family. Will initiate new goal regarding this. 08/07/20 Pt is well enough to return to [facility] which is a great relief to both Mr. And Mrs. Ruth Pierce as Mrs. Ruth Pierce is still working and Mr. Ruth Pierce is recovering from a knee surgery. 08/23/20. [name] is able to go back to the [facility] a couple of days a week. She participates in many activities and is very stimulated. 09/28/20 Since last month pt has had a decline in general health, anorexia, apathy, not wanting to get out of bed. Daughter pushes fluids and has now just asked her mother what sounds good to eat and provided it for her. She hasn't gone to the [facility] in awhile and it is unknown if she will return at this point. Encouraged daughter to continually engage, talk with, and have her mother  complete tasks for mental stimulation. At this time she still is participating in conversations. 11/09/20 Improved general health and return to being interactive with family and others in the community. She is participating in activites at the [facility] center. Praised daughter for all her efforts to provide for her mother. Goal Met.         Eulah Pont. Myrtie Neither, MSN, [facility] Gerontological Nurse Practitioner [facility] Care Management [phone]

## 2020-11-11 DIAGNOSIS — Z515 Encounter for palliative care: Secondary | ICD-10-CM | POA: Diagnosis not present

## 2020-11-11 DIAGNOSIS — I739 Peripheral vascular disease, unspecified: Secondary | ICD-10-CM | POA: Diagnosis not present

## 2020-11-11 DIAGNOSIS — B351 Tinea unguium: Secondary | ICD-10-CM | POA: Diagnosis not present

## 2020-12-02 DIAGNOSIS — J449 Chronic obstructive pulmonary disease, unspecified: Secondary | ICD-10-CM | POA: Diagnosis not present

## 2020-12-20 DIAGNOSIS — E785 Hyperlipidemia, unspecified: Secondary | ICD-10-CM | POA: Diagnosis not present

## 2020-12-20 DIAGNOSIS — E43 Unspecified severe protein-calorie malnutrition: Secondary | ICD-10-CM | POA: Diagnosis not present

## 2020-12-20 DIAGNOSIS — N390 Urinary tract infection, site not specified: Secondary | ICD-10-CM | POA: Diagnosis not present

## 2020-12-22 ENCOUNTER — Emergency Department (HOSPITAL_COMMUNITY): Payer: Medicare Other

## 2020-12-22 ENCOUNTER — Other Ambulatory Visit: Payer: Self-pay

## 2020-12-22 ENCOUNTER — Emergency Department (HOSPITAL_COMMUNITY)
Admission: EM | Admit: 2020-12-22 | Discharge: 2020-12-22 | Disposition: A | Discharge status: Home / Self Care | Payer: Medicare Other | Attending: Emergency Medicine | Admitting: Emergency Medicine

## 2020-12-22 ENCOUNTER — Encounter (HOSPITAL_COMMUNITY): Payer: Self-pay | Admitting: Emergency Medicine

## 2020-12-22 DIAGNOSIS — Z20822 Contact with and (suspected) exposure to covid-19: Secondary | ICD-10-CM | POA: Insufficient documentation

## 2020-12-22 DIAGNOSIS — R112 Nausea with vomiting, unspecified: Secondary | ICD-10-CM | POA: Insufficient documentation

## 2020-12-22 DIAGNOSIS — N3000 Acute cystitis without hematuria: Secondary | ICD-10-CM | POA: Insufficient documentation

## 2020-12-22 DIAGNOSIS — J449 Chronic obstructive pulmonary disease, unspecified: Secondary | ICD-10-CM | POA: Diagnosis not present

## 2020-12-22 DIAGNOSIS — R300 Dysuria: Secondary | ICD-10-CM | POA: Diagnosis present

## 2020-12-22 DIAGNOSIS — R059 Cough, unspecified: Secondary | ICD-10-CM | POA: Diagnosis not present

## 2020-12-22 DIAGNOSIS — R197 Diarrhea, unspecified: Secondary | ICD-10-CM | POA: Diagnosis not present

## 2020-12-22 DIAGNOSIS — F0150 Vascular dementia without behavioral disturbance: Secondary | ICD-10-CM | POA: Diagnosis not present

## 2020-12-22 DIAGNOSIS — I10 Essential (primary) hypertension: Secondary | ICD-10-CM | POA: Diagnosis not present

## 2020-12-22 DIAGNOSIS — Z79899 Other long term (current) drug therapy: Secondary | ICD-10-CM | POA: Insufficient documentation

## 2020-12-22 LAB — CBC WITH DIFFERENTIAL/PLATELET
Abs Immature Granulocytes: 0.03 10*3/uL (ref 0.00–0.07)
Basophils Absolute: 0 10*3/uL (ref 0.0–0.1)
Basophils Relative: 0 %
Eosinophils Absolute: 0 10*3/uL (ref 0.0–0.5)
Eosinophils Relative: 0 %
HCT: 43.1 % (ref 36.0–46.0)
Hemoglobin: 13.6 g/dL (ref 12.0–15.0)
Immature Granulocytes: 0 %
Lymphocytes Relative: 10 %
Lymphs Abs: 0.9 10*3/uL (ref 0.7–4.0)
MCH: 31.4 pg (ref 26.0–34.0)
MCHC: 31.6 g/dL (ref 30.0–36.0)
MCV: 99.5 fL (ref 80.0–100.0)
Monocytes Absolute: 0.5 10*3/uL (ref 0.1–1.0)
Monocytes Relative: 6 %
Neutro Abs: 7.5 10*3/uL (ref 1.7–7.7)
Neutrophils Relative %: 84 %
Platelets: 271 10*3/uL (ref 150–400)
RBC: 4.33 MIL/uL (ref 3.87–5.11)
RDW: 14.5 % (ref 11.5–15.5)
WBC: 9 10*3/uL (ref 4.0–10.5)
nRBC: 0 % (ref 0.0–0.2)

## 2020-12-22 LAB — URINALYSIS, ROUTINE W REFLEX MICROSCOPIC
Bacteria, UA: NONE SEEN
Bilirubin Urine: NEGATIVE
Glucose, UA: NEGATIVE mg/dL
Hgb urine dipstick: NEGATIVE
Ketones, ur: 5 mg/dL — AB
Nitrite: NEGATIVE
Protein, ur: 30 mg/dL — AB
Specific Gravity, Urine: 1.023 (ref 1.005–1.030)
pH: 5 (ref 5.0–8.0)

## 2020-12-22 LAB — RESP PANEL BY RT-PCR (FLU A&B, COVID) ARPGX2
Influenza A by PCR: NEGATIVE
Influenza B by PCR: NEGATIVE
SARS Coronavirus 2 by RT PCR: NEGATIVE

## 2020-12-22 LAB — COMPREHENSIVE METABOLIC PANEL
ALT: 8 U/L (ref 0–44)
AST: 16 U/L (ref 15–41)
Albumin: 4 g/dL (ref 3.5–5.0)
Alkaline Phosphatase: 60 U/L (ref 38–126)
Anion gap: 9 (ref 5–15)
BUN: 36 mg/dL — ABNORMAL HIGH (ref 8–23)
CO2: 25 mmol/L (ref 22–32)
Calcium: 9.4 mg/dL (ref 8.9–10.3)
Chloride: 104 mmol/L (ref 98–111)
Creatinine, Ser: 1.06 mg/dL — ABNORMAL HIGH (ref 0.44–1.00)
GFR, Estimated: 49 mL/min — ABNORMAL LOW (ref >60)
Glucose, Bld: 171 mg/dL — ABNORMAL HIGH (ref 70–99)
Potassium: 4.4 mmol/L (ref 3.5–5.1)
Sodium: 138 mmol/L (ref 135–145)
Total Bilirubin: 1 mg/dL (ref 0.3–1.2)
Total Protein: 7.2 g/dL (ref 6.5–8.1)

## 2020-12-22 LAB — LACTIC ACID, PLASMA
Lactic Acid, Venous: 1.9 mmol/L (ref 0.5–1.9)
Lactic Acid, Venous: 1.7 mmol/L (ref 0.5–1.9)

## 2020-12-22 LAB — LIPASE, BLOOD: Lipase: 52 U/L — ABNORMAL HIGH (ref 11–51)

## 2020-12-22 MED ORDER — SODIUM CHLORIDE 0.9 % IV SOLN
1.0000 g | Freq: Once | INTRAVENOUS | Status: AC
  Administered 2020-12-22: 1 g via INTRAVENOUS
  Filled 2020-12-22: qty 10

## 2020-12-22 MED ORDER — CEPHALEXIN 500 MG PO CAPS: 500.0000 mg | ORAL_CAPSULE | Freq: Three times a day (TID) | ORAL | 0 refills | Status: AC

## 2020-12-22 NOTE — ED Triage Notes (Signed)
Pt daughter states that pt mother is being treated for UTI. She started antibiotics yesterday. Daughter states pt has had multiple episodes of vomiting and diarrhea starting this morning. Pt is on palliative care, palliative nurse told daughter to bring her here to get treated for UTI since pt couldn't keep anything down at home. Pt had dementia and gets angry at times per daughter.

## 2020-12-22 NOTE — ED Provider Notes (Signed)
[facility] EMERGENCY DEPARTMENT Provider Note   CSN: [id number] Arrival date & time: 12/22/20  1104     History Chief Complaint  Patient presents with   Emesis    Ruth Pierce is a 87 y.o. female.  HPI  Patient with significant medical history of COPD, dementia, hypertension, presents to the emergency department with chief complaint of nausea and vomiting.  Patient states this started last night and has progressed into today.  She states that she was recently diagnosed with a UTI, started on ciprofloxacin, she had her first dose last night, made her feel unwell and had a single episode of vomiting.  She then woke up this morning ate her breakfast and then had another episode of vomiting accompanied with diarrhea.  She denies hematemesis, coffee-ground emesis, hematochezia or melena, states that she has no stomach pain at this time, she has no significant abdominal history, denies history of bowel obstruction, pancreatitis, diverticulitis, PUD, NSAID use or alcohol use.  She does endorse that she has had some fevers and chills, and slight nasal congestion with a dry cough started a few days ago.  She states that her UTI symptoms started approximately 1 week ago, endorses urgency, frequency and dysuria.  She has no other complaints at this time.   Daughter is a bedside able to validate story, she also endorses that patient has not been eating and drinking very much, she has lost approximately 10 pounds, she is concerned with the dramatic weight loss.  Past Medical History:  Diagnosis Date   COPD (chronic obstructive pulmonary disease) ([place])    Dementia ([place])    Hypertension     Patient Active Problem List   Diagnosis Date Noted   Vascular dementia ([place]) 07/14/2020   HTN (hypertension) 07/14/2020   Vitamin D deficiency 07/14/2020   Giant cell arteritis ([place]) 07/14/2020   Headache 07/14/2020    Past Surgical History:  Procedure Laterality Date   TOOTH EXTRACTION       OB  History   No obstetric history on file.     History reviewed. No pertinent family history.  Social History   Tobacco Use   Smoking status: Never   Smokeless tobacco: Never  Substance Use Topics   Drug use: Never    Home Medications Prior to Admission medications   Medication Sig Start Date End Date Taking? Authorizing Provider  cephALEXin (KEFLEX) 500 MG capsule Take 1 capsule (500 mg total) by mouth 3 (three) times daily for 6 days. 12/22/20 12/28/20 Yes Marcello Fennel, PA-C  albuterol (VENTOLIN HFA) 108 (90 Base) MCG/ACT inhaler Inhale 1-2 puffs into the lungs every 4 (four) hours as needed for wheezing or shortness of breath. 07/18/20   Long, Wonda Olds, MD  losartan (COZAAR) 50 MG tablet Take 1 tablet by mouth daily. 05/02/20   [provider]  Vitamin D, Ergocalciferol, (DRISDOL) 1.25 MG (50000 UNIT) CAPS capsule Take 50,000 Units by mouth every 7 (seven) days. 05/02/20   [provider]    Allergies    Patient has no known allergies.  Review of Systems   Review of Systems  Constitutional:  Positive for chills and fever.  HENT:  Positive for congestion.   Respiratory:  Positive for cough. Negative for shortness of breath.   Cardiovascular:  Negative for chest pain.  Gastrointestinal:  Positive for diarrhea, nausea and vomiting. Negative for abdominal pain.  Genitourinary:  Positive for dysuria and frequency. Negative for enuresis.  Musculoskeletal:  Negative for back pain.  Skin:  Negative for rash.  Neurological:  Negative for dizziness and headaches.  Hematological:  Does not bruise/bleed easily.   Physical Exam Updated Vital Signs BP 115/62   Pulse 78   Temp (!) 97 F (36.1 C) (Temporal)   Resp 16   Ht 5' (1.524 m)   Wt 44.9 kg   SpO2 95%   BMI 19.33 kg/m   Physical Exam Vitals and nursing note reviewed.  Constitutional:      General: She is not in acute distress.    Appearance: She is not ill-appearing.  HENT:     Head:  Normocephalic and atraumatic.     Nose: No congestion.     Mouth/Throat:     Mouth: Mucous membranes are moist.     Pharynx: Oropharynx is clear.  Eyes:     Conjunctiva/sclera: Conjunctivae normal.  Cardiovascular:     Rate and Rhythm: Normal rate and regular rhythm.     Pulses: Normal pulses.     Heart sounds: No murmur heard.   No friction rub. No gallop.  Pulmonary:     Effort: No respiratory distress.     Breath sounds: No wheezing, rhonchi or rales.  Abdominal:     Palpations: Abdomen is soft.     Tenderness: There is no abdominal tenderness. There is no right CVA tenderness or left CVA tenderness.  Musculoskeletal:        General: Normal range of motion.     Right lower leg: No edema.     Left lower leg: No edema.  Skin:    General: Skin is warm and dry.  Neurological:     Mental Status: She is alert.     Comments: No facial asymmetry, no difficult word finding, no slurring of words, able to follow two-step commands, no unilateral weakness present.  Psychiatric:        Mood and Affect: Mood normal.    ED Results / Procedures / Treatments   Labs (all labs ordered are listed, but only abnormal results are displayed) Labs Reviewed  COMPREHENSIVE METABOLIC PANEL - Abnormal; Notable for the following components:      Result Value   Glucose, Bld 171 (*)    BUN 36 (*)    Creatinine, Ser 1.06 (*)    GFR, Estimated 49 (*)    All other components within normal limits  LIPASE, BLOOD - Abnormal; Notable for the following components:   Lipase 52 (*)    All other components within normal limits  URINALYSIS, ROUTINE W REFLEX MICROSCOPIC - Abnormal; Notable for the following components:   APPearance HAZY (*)    Ketones, ur 5 (*)    Protein, ur 30 (*)    Leukocytes,Ua SMALL (*)    All other components within normal limits  RESP PANEL BY RT-PCR (FLU A&B, COVID) ARPGX2  URINE CULTURE  CBC WITH DIFFERENTIAL/PLATELET  LACTIC ACID, PLASMA  LACTIC ACID, PLASMA     EKG None  Radiology DG Chest 1 View  Result Date: 12/22/2020 CLINICAL DATA:  Cough EXAM: CHEST  1 VIEW COMPARISON:  Chest radiograph 07/18/2020 FINDINGS: The cardiomediastinal silhouette is normal. The lungs appear hyperinflated, consistent with COPD. Linear opacities in the left lower lobe likely reflects scarring and/or atelectasis. There is no focal consolidation or pulmonary edema. There is no pleural effusion or pneumothorax. There is no acute osseous abnormality. IMPRESSION: Hyperinflation of the lungs consistent with COPD. Otherwise, no radiographic evidence of acute cardiopulmonary process. Electronically Signed   By: Valetta Mole  M.D.   On: 12/22/2020 15:05    Procedures Procedures   Medications Ordered in ED Medications  cefTRIAXone (ROCEPHIN) 1 g in sodium chloride 0.9 % 100 mL IVPB (1 g Intravenous New Bag/Given 12/22/20 2054)    ED Course  I have reviewed the triage vital signs and the nursing notes.  Pertinent labs & imaging results that were available during my care of the patient were reviewed by me and considered in my medical decision making (see chart for details).    MDM Rules/Calculators/A&P                          Initial impression-presents with nausea vomiting cough and congestion.  She is alert, does not appear acute stress, vital signs 04 oral temperature of 97.  Concern for possible viral infection versus worsening UTI.  Will obtain basic lab work-up, and reassess.  Work-up-CBC unremarkable, CMP shows slight hyperglycemia of 171, BUN 36, creatinine 1.06, GFR 49.  UA shows small leukocytes, white blood cells, no bacteria, no squamous cells.  Lipase 52, lactic 1.7, respiratory panel negative chest x-ray shows hyperinflation of the lungs consistent with COPD no other acute abnormalities present.  Reassessment-patient was reassessed, has no complaints at this time, abdomen was reassessed nontender to palpation.  Will p.o. challenge and further  evaluate.  Patient is tolerating p.o., still has no nausea or vomiting.  We will give patient a shot of ceftriaxone discharged on Keflex.  Rule out-I have low suspicion for systemic infection as patient nontoxic-appearing, vital signs reassuring, no leukocytosis noted on CBC, no elevation in lactic.  I have low suspicion for bowel obstruction as abdomen is nondistended dull to percussion, patient is tolerating p.o., still passing gas and having bowel movements.  I have low suspicion for liver or gallbladder abnormality as she has no right upper quadrant tenderness, no elevation liver enzymes or alk phos.  Low suspicion for appendicitis as she has no right lower quadrant pain, no white count, afebrile.  Low suspicion for complicated diverticulitis as presentation atypical etiology.  Low suspicion for kidney stone and/or Pilo she has no white count, no CVA tenderness, UA negative for nitrates, or hematuria.  I have low suspicion for pancreatitis as lipase within normal limits.  Low suspicion for C. difficile patient had 1 episode of diarrhea, is non-foul-smelling, no  mucus stools, no associated stomach pain, presentation atypical etiology.  Plan-  Nausea vomiting since resolved-likely this is secondary due to medication reaction ciprofloxacin, will change her to Keflex, give her a dose of Rocephin have close follow-up with her PCP.  Vital signs have remained stable, no indication for hospital admission.  Patient discussed with attending and they agreed with assessment and plan.  Patient given at home care as well strict return precautions.  Patient verbalized that they understood agreed to said plan.  Final Clinical Impression(s) / ED Diagnoses Final diagnoses:  Nausea vomiting and diarrhea  Acute cystitis without hematuria    Rx / DC Orders ED Discharge Orders          Ordered    cephALEXin (KEFLEX) 500 MG capsule  3 times daily        12/22/20 2109             Marcello Fennel,  PA-C 12/22/20 2114    Carmin Muskrat, MD 12/23/20 1407

## 2020-12-22 NOTE — Discharge Instructions (Addendum)
Lab work and imaging were reassuring.  I want you to stop taking the ciprofloxacin as feel this caused her to be sick.  Started you on a new antibiotic please take as prescribed.  Please remember to stay hydrated please drink plenty fluids.  Please follow-up with your primary care provider in 1 week time for reevaluation.  Would like a repeat UA.  Come back to emergency department if the patient is unable to tolerate this new medication, she has fevers or chills, severe stomach pain, uncontrolled nausea, vomiting, diarrhea, or becomes more altered.

## 2020-12-23 ENCOUNTER — Telehealth (HOSPITAL_COMMUNITY): Payer: Self-pay | Admitting: Student

## 2020-12-23 DIAGNOSIS — Z515 Encounter for palliative care: Secondary | ICD-10-CM | POA: Diagnosis not present

## 2020-12-23 NOTE — Telephone Encounter (Signed)
Sent antibiotic to pharmacy

## 2020-12-24 LAB — URINE CULTURE: Culture: 20000 — AB

## 2020-12-25 ENCOUNTER — Telehealth: Payer: Self-pay | Admitting: Emergency Medicine

## 2020-12-25 NOTE — Telephone Encounter (Signed)
Post ED Visit - Positive Culture Follow-up  Culture report reviewed by antimicrobial stewardship pharmacist: [facility] []  , Pharm.D. []  Enzo Bi, Pharm.D., BCPS AQ-ID []  , Pharm.D., BCPS []  Celedonio Miyamoto, Pharm.D., BCPS []  Amoret, Garvin Fila.D., BCPS, AAHIVP []  , Pharm.D., BCPS, AAHIVP []  Georgina Pillion, PharmD, BCPS []  , PharmD, BCPS []  [place], PharmD, BCPS [x]  [address], PharmD []  , PharmD, BCPS []  Estella Husk, PharmD  Pharmacy Team []  Lysle Pearl, PharmD []  , PharmD []  Phillips Climes, PharmD []  , Rph []  Agapito Games) , PharmD []  Delmar Landau, PharmD []  , PharmD []  Mervyn Gay, PharmD []  , PharmD []  Vinnie Level, PharmD []  Wonda Olds, PharmD []  , PharmD []  Len Childs, PharmD   Positive urine culture Treated with Cephalexin, organism sensitive to the same and no further patient follow-up is required at this time.  Sanaya Gwilliam 12/25/2020, 2:04 PM

## 2021-01-01 DIAGNOSIS — J449 Chronic obstructive pulmonary disease, unspecified: Secondary | ICD-10-CM | POA: Diagnosis not present

## 2021-01-07 DIAGNOSIS — N390 Urinary tract infection, site not specified: Secondary | ICD-10-CM | POA: Diagnosis not present

## 2021-01-07 DIAGNOSIS — W19XXXA Unspecified fall, initial encounter: Secondary | ICD-10-CM | POA: Diagnosis not present

## 2021-02-01 DIAGNOSIS — J449 Chronic obstructive pulmonary disease, unspecified: Secondary | ICD-10-CM | POA: Diagnosis not present

## 2021-02-01 DIAGNOSIS — Z515 Encounter for palliative care: Secondary | ICD-10-CM | POA: Diagnosis not present

## 2021-02-27 DIAGNOSIS — Z515 Encounter for palliative care: Secondary | ICD-10-CM | POA: Diagnosis not present

## 2021-03-03 DIAGNOSIS — J449 Chronic obstructive pulmonary disease, unspecified: Secondary | ICD-10-CM | POA: Diagnosis not present

## 2021-03-30 DIAGNOSIS — I739 Peripheral vascular disease, unspecified: Secondary | ICD-10-CM | POA: Diagnosis not present

## 2021-03-30 DIAGNOSIS — B351 Tinea unguium: Secondary | ICD-10-CM | POA: Diagnosis not present

## 2021-04-03 DIAGNOSIS — J449 Chronic obstructive pulmonary disease, unspecified: Secondary | ICD-10-CM | POA: Diagnosis not present

## 2021-04-05 DIAGNOSIS — Z515 Encounter for palliative care: Secondary | ICD-10-CM | POA: Diagnosis not present

## 2021-04-07 DIAGNOSIS — E43 Unspecified severe protein-calorie malnutrition: Secondary | ICD-10-CM | POA: Diagnosis not present

## 2021-04-07 DIAGNOSIS — I10 Essential (primary) hypertension: Secondary | ICD-10-CM | POA: Diagnosis not present

## 2021-04-08 ENCOUNTER — Other Ambulatory Visit: Payer: Self-pay

## 2021-04-08 ENCOUNTER — Emergency Department (HOSPITAL_COMMUNITY)
Admission: EM | Admit: 2021-04-08 | Discharge: 2021-04-08 | Disposition: A | Discharge status: Home / Self Care | Payer: Medicare Other | Attending: Emergency Medicine | Admitting: Emergency Medicine

## 2021-04-08 ENCOUNTER — Encounter (HOSPITAL_COMMUNITY): Payer: Self-pay

## 2021-04-08 DIAGNOSIS — F0390 Unspecified dementia without behavioral disturbance: Secondary | ICD-10-CM | POA: Diagnosis not present

## 2021-04-08 DIAGNOSIS — I10 Essential (primary) hypertension: Secondary | ICD-10-CM | POA: Diagnosis not present

## 2021-04-08 DIAGNOSIS — Z599 Problem related to housing and economic circumstances, unspecified: Secondary | ICD-10-CM | POA: Diagnosis not present

## 2021-04-08 DIAGNOSIS — Z743 Need for continuous supervision: Secondary | ICD-10-CM | POA: Diagnosis not present

## 2021-04-08 DIAGNOSIS — Z658 Other specified problems related to psychosocial circumstances: Secondary | ICD-10-CM
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
## 2021-05-03 DIAGNOSIS — Z515 Encounter for palliative care: Secondary | ICD-10-CM | POA: Diagnosis not present

## 2021-05-04 DIAGNOSIS — J449 Chronic obstructive pulmonary disease, unspecified: Secondary | ICD-10-CM | POA: Diagnosis not present

## 2021-05-31 DIAGNOSIS — Z515 Encounter for palliative care: Secondary | ICD-10-CM | POA: Diagnosis not present

## 2021-06-01 DIAGNOSIS — J449 Chronic obstructive pulmonary disease, unspecified: Secondary | ICD-10-CM | POA: Diagnosis not present

## 2021-06-30 DIAGNOSIS — Z515 Encounter for palliative care: Secondary | ICD-10-CM | POA: Diagnosis not present

## 2021-07-02 DIAGNOSIS — J449 Chronic obstructive pulmonary disease, unspecified: Secondary | ICD-10-CM | POA: Diagnosis not present

## 2021-07-10 DIAGNOSIS — E559 Vitamin D deficiency, unspecified: Secondary | ICD-10-CM | POA: Diagnosis not present

## 2021-07-10 DIAGNOSIS — R7301 Impaired fasting glucose: Secondary | ICD-10-CM | POA: Diagnosis not present

## 2021-07-10 DIAGNOSIS — I10 Essential (primary) hypertension: Secondary | ICD-10-CM | POA: Diagnosis not present

## 2021-07-13 DIAGNOSIS — I10 Essential (primary) hypertension: Secondary | ICD-10-CM | POA: Diagnosis not present

## 2021-07-13 DIAGNOSIS — H5452A1 Low vision left eye category 1, normal vision right eye: Secondary | ICD-10-CM | POA: Diagnosis not present

## 2021-07-13 DIAGNOSIS — J45909 Unspecified asthma, uncomplicated: Secondary | ICD-10-CM | POA: Diagnosis not present

## 2021-07-13 DIAGNOSIS — E559 Vitamin D deficiency, unspecified: Secondary | ICD-10-CM | POA: Diagnosis not present

## 2021-07-15 ENCOUNTER — Emergency Department (HOSPITAL_BASED_OUTPATIENT_CLINIC_OR_DEPARTMENT_OTHER): Payer: Medicare Other | Admitting: Certified Registered Nurse Anesthetist

## 2021-07-15 ENCOUNTER — Other Ambulatory Visit: Payer: Self-pay

## 2021-07-15 ENCOUNTER — Encounter (HOSPITAL_COMMUNITY): Payer: Self-pay | Admitting: Emergency Medicine

## 2021-07-15 ENCOUNTER — Ambulatory Visit (HOSPITAL_COMMUNITY)
Admission: EM | Admit: 2021-07-15 | Discharge: 2021-07-16 | Disposition: A | Discharge status: Home / Self Care | Payer: Medicare Other | Attending: Emergency Medicine | Admitting: Emergency Medicine

## 2021-07-15 ENCOUNTER — Emergency Department (HOSPITAL_COMMUNITY): Payer: Medicare Other | Admitting: Certified Registered Nurse Anesthetist

## 2021-07-15 ENCOUNTER — Encounter (HOSPITAL_COMMUNITY)
Admission: EM | Disposition: A | Discharge status: Home / Self Care | Payer: Self-pay | Source: Home / Self Care | Attending: Student

## 2021-07-15 ENCOUNTER — Emergency Department (HOSPITAL_COMMUNITY): Payer: Medicare Other

## 2021-07-15 DIAGNOSIS — N132 Hydronephrosis with renal and ureteral calculous obstruction: Secondary | ICD-10-CM

## 2021-07-15 DIAGNOSIS — R1031 Right lower quadrant pain: Secondary | ICD-10-CM | POA: Diagnosis not present

## 2021-07-15 DIAGNOSIS — I10 Essential (primary) hypertension: Secondary | ICD-10-CM | POA: Diagnosis not present

## 2021-07-15 DIAGNOSIS — F0390 Unspecified dementia without behavioral disturbance: Secondary | ICD-10-CM | POA: Diagnosis not present

## 2021-07-15 DIAGNOSIS — Z743 Need for continuous supervision: Secondary | ICD-10-CM | POA: Diagnosis not present

## 2021-07-15 DIAGNOSIS — J449 Chronic obstructive pulmonary disease, unspecified: Secondary | ICD-10-CM

## 2021-07-15 DIAGNOSIS — R110 Nausea: Secondary | ICD-10-CM | POA: Diagnosis not present

## 2021-07-15 DIAGNOSIS — R6889 Other general symptoms and signs: Secondary | ICD-10-CM | POA: Diagnosis not present

## 2021-07-15 DIAGNOSIS — N201 Calculus of ureter: Secondary | ICD-10-CM | POA: Diagnosis not present

## 2021-07-15 DIAGNOSIS — N1330 Unspecified hydronephrosis: Secondary | ICD-10-CM | POA: Diagnosis not present

## 2021-07-15 DIAGNOSIS — N200 Calculus of kidney: Secondary | ICD-10-CM

## 2021-07-15 DIAGNOSIS — F0150 Vascular dementia without behavioral disturbance: Secondary | ICD-10-CM | POA: Diagnosis not present

## 2021-07-15 DIAGNOSIS — R109 Unspecified abdominal pain: Secondary | ICD-10-CM | POA: Diagnosis not present

## 2021-07-15 HISTORY — PX: CYSTOSCOPY WITH RETROGRADE PYELOGRAM, URETEROSCOPY AND STENT PLACEMENT: SHX5789

## 2021-07-15 HISTORY — DX: Vascular dementia, unspecified severity, without behavioral disturbance, psychotic disturbance, mood disturbance, and anxiety: F01.50

## 2021-07-15 LAB — CBC WITH DIFFERENTIAL/PLATELET
Abs Immature Granulocytes: 0.04 10*3/uL (ref 0.00–0.07)
Basophils Absolute: 0 10*3/uL (ref 0.0–0.1)
Basophils Relative: 0 %
Eosinophils Absolute: 0.1 10*3/uL (ref 0.0–0.5)
Eosinophils Relative: 1 %
HCT: 42.2 % (ref 36.0–46.0)
Hemoglobin: 13.5 g/dL (ref 12.0–15.0)
Immature Granulocytes: 1 %
Lymphocytes Relative: 14 %
Lymphs Abs: 1.2 10*3/uL (ref 0.7–4.0)
MCH: 31.1 pg (ref 26.0–34.0)
MCHC: 32 g/dL (ref 30.0–36.0)
MCV: 97.2 fL (ref 80.0–100.0)
Monocytes Absolute: 0.5 10*3/uL (ref 0.1–1.0)
Monocytes Relative: 6 %
Neutro Abs: 6.4 10*3/uL (ref 1.7–7.7)
Neutrophils Relative %: 78 %
Platelets: 235 10*3/uL (ref 150–400)
RBC: 4.34 MIL/uL (ref 3.87–5.11)
RDW: 13.7 % (ref 11.5–15.5)
WBC: 8.2 10*3/uL (ref 4.0–10.5)
nRBC: 0 % (ref 0.0–0.2)

## 2021-07-15 LAB — COMPREHENSIVE METABOLIC PANEL
ALT: 8 U/L (ref 0–44)
AST: 22 U/L (ref 15–41)
Albumin: 4 g/dL (ref 3.5–5.0)
Alkaline Phosphatase: 69 U/L (ref 38–126)
Anion gap: 9 (ref 5–15)
BUN: 23 mg/dL (ref 8–23)
CO2: 25 mmol/L (ref 22–32)
Calcium: 9 mg/dL (ref 8.9–10.3)
Chloride: 104 mmol/L (ref 98–111)
Creatinine, Ser: 0.91 mg/dL (ref 0.44–1.00)
GFR, Estimated: 59 mL/min — ABNORMAL LOW (ref >60)
Glucose, Bld: 118 mg/dL — ABNORMAL HIGH (ref 70–99)
Potassium: 4.2 mmol/L (ref 3.5–5.1)
Sodium: 138 mmol/L (ref 135–145)
Total Bilirubin: 0.7 mg/dL (ref 0.3–1.2)
Total Protein: 7.4 g/dL (ref 6.5–8.1)

## 2021-07-15 LAB — URINALYSIS, ROUTINE W REFLEX MICROSCOPIC
Bilirubin Urine: NEGATIVE
Glucose, UA: NEGATIVE mg/dL
Hgb urine dipstick: NEGATIVE
Ketones, ur: NEGATIVE mg/dL
Nitrite: NEGATIVE
Protein, ur: NEGATIVE mg/dL
Specific Gravity, Urine: 1.009 (ref 1.005–1.030)
pH: 7 (ref 5.0–8.0)

## 2021-07-15 LAB — LIPASE, BLOOD: Lipase: 51 U/L (ref 11–51)

## 2021-07-15 SURGERY — CYSTOURETEROSCOPY, WITH RETROGRADE PYELOGRAM AND STENT INSERTION
Anesthesia: General | Laterality: Right

## 2021-07-15 MED ORDER — LIDOCAINE 2% (20 MG/ML) 5 ML SYRINGE
INTRAMUSCULAR | Status: DC | PRN
  Administered 2021-07-15: 40 mg via INTRAVENOUS

## 2021-07-15 MED ORDER — LACTATED RINGERS IV SOLN: INTRAVENOUS | Status: DC | PRN

## 2021-07-15 MED ORDER — IOHEXOL 300 MG/ML  SOLN
75.0000 mL | Freq: Once | INTRAMUSCULAR | Status: AC | PRN
  Administered 2021-07-15: 75 mL via INTRAVENOUS

## 2021-07-15 MED ORDER — SODIUM CHLORIDE 0.9 % IR SOLN
Status: DC | PRN
  Administered 2021-07-15: 3000 mL

## 2021-07-15 MED ORDER — LACTATED RINGERS IV BOLUS
1000.0000 mL | Freq: Once | INTRAVENOUS | Status: AC
  Administered 2021-07-15: 1000 mL via INTRAVENOUS

## 2021-07-15 MED ORDER — ONDANSETRON HCL 4 MG/2ML IJ SOLN
4.0000 mg | Freq: Once | INTRAMUSCULAR | Status: AC
  Administered 2021-07-15: 4 mg via INTRAVENOUS
  Filled 2021-07-15: qty 2

## 2021-07-15 MED ORDER — CEFAZOLIN SODIUM-DEXTROSE 1-4 GM/50ML-% IV SOLN
INTRAVENOUS | Status: DC | PRN
  Administered 2021-07-15: 1 g via INTRAVENOUS

## 2021-07-15 MED ORDER — DEXAMETHASONE SODIUM PHOSPHATE 10 MG/ML IJ SOLN
INTRAMUSCULAR | Status: DC | PRN
  Administered 2021-07-15: 4 mg via INTRAVENOUS

## 2021-07-15 MED ORDER — CEFAZOLIN SODIUM-DEXTROSE 1-4 GM/50ML-% IV SOLN
INTRAVENOUS | Status: AC
  Filled 2021-07-15: qty 50

## 2021-07-15 MED ORDER — ONDANSETRON HCL 4 MG/2ML IJ SOLN
INTRAMUSCULAR | Status: DC | PRN
  Administered 2021-07-15: 4 mg via INTRAVENOUS

## 2021-07-15 MED ORDER — PROPOFOL 10 MG/ML IV BOLUS
INTRAVENOUS | Status: DC | PRN
  Administered 2021-07-15: 70 mg via INTRAVENOUS

## 2021-07-15 MED ORDER — LOSARTAN POTASSIUM 25 MG PO TABS
50.0000 mg | ORAL_TABLET | Freq: Every day | ORAL | Status: DC
  Administered 2021-07-15: 50 mg via ORAL
  Filled 2021-07-15: qty 2

## 2021-07-15 MED ORDER — FENTANYL CITRATE (PF) 100 MCG/2ML IJ SOLN
INTRAMUSCULAR | Status: DC | PRN
  Administered 2021-07-15: 50 ug via INTRAVENOUS

## 2021-07-15 MED ORDER — PHENYLEPHRINE 80 MCG/ML (10ML) SYRINGE FOR IV PUSH (FOR BLOOD PRESSURE SUPPORT)
PREFILLED_SYRINGE | INTRAVENOUS | Status: DC | PRN
  Administered 2021-07-15: 80 ug via INTRAVENOUS
  Administered 2021-07-15: 40 ug via INTRAVENOUS

## 2021-07-15 MED ORDER — IOHEXOL 300 MG/ML  SOLN
INTRAMUSCULAR | Status: DC | PRN
  Administered 2021-07-15: 10 mL via URETHRAL

## 2021-07-15 MED ORDER — MORPHINE SULFATE (PF) 4 MG/ML IV SOLN
3.0000 mg | Freq: Once | INTRAVENOUS | Status: AC
  Administered 2021-07-15: 3 mg via INTRAVENOUS
  Filled 2021-07-15: qty 1

## 2021-07-15 MED ORDER — FENTANYL CITRATE PF 50 MCG/ML IJ SOSY: 25.0000 ug | PREFILLED_SYRINGE | INTRAMUSCULAR | Status: DC | PRN

## 2021-07-15 SURGICAL SUPPLY — 23 items
BAG COUNTER SPONGE SURGICOUNT (BAG) IMPLANT
BAG URO CATCHER STRL LF (MISCELLANEOUS) ×2 IMPLANT
BASKET ZERO TIP NITINOL 2.4FR (BASKET) IMPLANT
CATH URETERAL DUAL LUMEN 10F (MISCELLANEOUS) ×2 IMPLANT
CATH URETL OPEN END 6FR 70 (CATHETERS) ×1 IMPLANT
CLOTH BEACON ORANGE TIMEOUT ST (SAFETY) ×2 IMPLANT
GLOVE SURG LX 7.5 STRW (GLOVE) ×1
GLOVE SURG LX STRL 7.5 STRW (GLOVE) ×1 IMPLANT
GLOVE SURG MICRO LTX SZ7 (GLOVE) ×2 IMPLANT
GUIDEWIRE STR DUAL SENSOR (WIRE) ×2 IMPLANT
GUIDEWIRE ZIPWRE .038 STRAIGHT (WIRE) ×2 IMPLANT
IV NS 1000ML (IV SOLUTION) ×2
IV NS 1000ML BAXH (IV SOLUTION) ×1 IMPLANT
KIT TURNOVER KIT A (KITS) IMPLANT
LASER FIB FLEXIVA PULSE ID 365 (Laser) IMPLANT
MANIFOLD NEPTUNE II (INSTRUMENTS) ×2 IMPLANT
PACK CYSTO (CUSTOM PROCEDURE TRAY) ×2 IMPLANT
SHEATH NAVIGATOR HD 11/13X36 (SHEATH) IMPLANT
STENT URET 6FRX22 CONTOUR (STENTS) ×1 IMPLANT
TRACTIP FLEXIVA PULS ID 200XHI (Laser) IMPLANT
TRACTIP FLEXIVA PULSE ID 200 (Laser)
TUBING CONNECTING 10 (TUBING) ×2 IMPLANT
TUBING UROLOGY SET (TUBING) ×2 IMPLANT

## 2021-07-15 NOTE — Consult Note (Signed)
Urology Consult  ? ?History of Present Illness: Ruth Pierce is a 88 y.o. who initially presented to the ED earlier this evening with acute RLQ pain. A CT scan was obatined which shows a R distal 4-5 mm UVJ calcification with associated hydronephrosis. On the delayed imaging, there is extravasation apparently form the collecting system ? ?She had one episode of emesis earlier. Upon arrival to the ED her SBP was >200 but this has improved.  ? ?[name] is afebrile and has no white count. Her Cr is 0.91, which appears to be around her baseline. Her UA is unremarkable as well.  ? ?Past Medical History:  ?Diagnosis Date  ? COPD (chronic obstructive pulmonary disease) ([place])   ? Dementia ([place])   ? Hypertension   ? Vascular dementia ([place])   ? ? ?Past Surgical History:  ?Procedure Laterality Date  ? TOOTH EXTRACTION    ? ? ? ?Current Hospital Medications: ? ?Home meds:  ?No current facility-administered medications on file prior to encounter.  ? ?Current Outpatient Medications on File Prior to Encounter  ?Medication Sig Dispense Refill  ? albuterol (VENTOLIN HFA) 108 (90 Base) MCG/ACT inhaler Inhale 1-2 puffs into the lungs every 4 (four) hours as needed for wheezing or shortness of breath. 6.7 g 0  ? aspirin 81 MG EC tablet Take 81 mg by mouth daily as needed for pain. Swallow whole.    ? ipratropium-albuterol (DUONEB) 0.5-2.5 (3) MG/3ML SOLN Inhale 3 mLs into the lungs daily as needed (for shorntess of breath).    ? losartan (COZAAR) 50 MG tablet Take 50 mg by mouth every evening.    ? Vitamin D, Ergocalciferol, (DRISDOL) 1.25 MG (50000 UNIT) CAPS capsule Take 50,000 Units by mouth every 7 (seven) days. Sunday    ?  ? ?Scheduled Meds: ? [MAR Hold] losartan  50 mg Oral Daily  ? ?Continuous Infusions: ?PRN Meds:. ? ?Allergies: No Known Allergies ? ?No family history on file. ? ?Social History:  reports that she has never smoked. She has never used smokeless tobacco. She reports that she does not use drugs. No history on  file for alcohol use. ? ?ROS: ?A complete review of systems was performed.  All systems are negative except for pertinent findings as noted. ? ?Physical Exam:  ?Vital signs in last 24 hours: ?Temp:  [97.9 ?F (36.6 ?C)-98.8 ?F (37.1 ?C)] 98.8 ?F (37.1 ?C) (04/29 2258) ?Pulse Rate:  [65-74] 71 (04/29 2258) ?Resp:  [12-25] 15 (04/29 2258) ?BP: (139-214)/(77-111) 151/81 (04/29 2258) ?SpO2:  [92 %-98 %] 93 % (04/29 2258) ?Weight:  [47.2 kg] 47.2 kg (04/29 1806) ?Constitutional:  Alert and oriented, No acute distress ?Cardiovascular: Regular rate and rhythm ?Respiratory: Normal respiratory effort, Lungs clear bilaterally ?GI: Abdomen is soft, nontender, nondistended, no abdominal masses ?GU: No CVA tenderness ?Neurologic: Grossly intact, no focal deficits ?Psychiatric: Normal mood and affect ? ?Laboratory Data:  ?Recent Labs  ?  07/15/21 ?[id number]  ?WBC 8.2  ?HGB 13.5  ?HCT 42.2  ?PLT 235  ? ? ?Recent Labs  ?  07/15/21 ?1821  ?NA 138  ?K 4.2  ?CL 104  ?GLUCOSE 118*  ?BUN 23  ?CALCIUM 9.0  ?CREATININE 0.91  ? ? ? ?Results for orders placed or performed during the hospital encounter of 07/15/21 (from the past 24 hour(s))  ?Urinalysis, Routine w reflex microscopic Urine, Clean Catch     Status: Abnormal  ? Collection Time: 07/15/21  6:11 PM  ?Result Value Ref Range  ? Color, Urine YELLOW  YELLOW  ? APPearance HAZY (A) CLEAR  ? Specific Gravity, Urine 1.009 1.005 - 1.030  ? pH 7.0 5.0 - 8.0  ? Glucose, UA NEGATIVE NEGATIVE mg/dL  ? Hgb urine dipstick NEGATIVE NEGATIVE  ? Bilirubin Urine NEGATIVE NEGATIVE  ? Ketones, ur NEGATIVE NEGATIVE mg/dL  ? Protein, ur NEGATIVE NEGATIVE mg/dL  ? Nitrite NEGATIVE NEGATIVE  ? Leukocytes,Ua TRACE (A) NEGATIVE  ? RBC / HPF 0-5 0 - 5 RBC/hpf  ? WBC, UA 0-5 0 - 5 WBC/hpf  ? Bacteria, UA MANY (A) NONE SEEN  ? Squamous Epithelial / LPF 0-5 0 - 5  ? Mucus PRESENT   ?CBC with Differential     Status: None  ? Collection Time: 07/15/21  6:21 PM  ?Result Value Ref Range  ? WBC 8.2 4.0 - 10.5 K/uL  ? RBC  4.34 3.87 - 5.11 MIL/uL  ? Hemoglobin 13.5 12.0 - 15.0 g/dL  ? HCT 42.2 36.0 - 46.0 %  ? MCV 97.2 80.0 - 100.0 fL  ? MCH 31.1 26.0 - 34.0 pg  ? MCHC 32.0 30.0 - 36.0 g/dL  ? RDW 13.7 11.5 - 15.5 %  ? Platelets 235 150 - 400 K/uL  ? nRBC 0.0 0.0 - 0.2 %  ? Neutrophils Relative % 78 %  ? Neutro Abs 6.4 1.7 - 7.7 K/uL  ? Lymphocytes Relative 14 %  ? Lymphs Abs 1.2 0.7 - 4.0 K/uL  ? Monocytes Relative 6 %  ? Monocytes Absolute 0.5 0.1 - 1.0 K/uL  ? Eosinophils Relative 1 %  ? Eosinophils Absolute 0.1 0.0 - 0.5 K/uL  ? Basophils Relative 0 %  ? Basophils Absolute 0.0 0.0 - 0.1 K/uL  ? Immature Granulocytes 1 %  ? Abs Immature Granulocytes 0.04 0.00 - 0.07 K/uL  ?Comprehensive metabolic panel     Status: Abnormal  ? Collection Time: 07/15/21  6:21 PM  ?Result Value Ref Range  ? Sodium 138 135 - 145 mmol/L  ? Potassium 4.2 3.5 - 5.1 mmol/L  ? Chloride 104 98 - 111 mmol/L  ? CO2 25 22 - 32 mmol/L  ? Glucose, Bld 118 (H) 70 - 99 mg/dL  ? BUN 23 8 - 23 mg/dL  ? Creatinine, Ser 0.91 0.44 - 1.00 mg/dL  ? Calcium 9.0 8.9 - 10.3 mg/dL  ? Total Protein 7.4 6.5 - 8.1 g/dL  ? Albumin 4.0 3.5 - 5.0 g/dL  ? AST 22 15 - 41 U/L  ? ALT 8 0 - 44 U/L  ? Alkaline Phosphatase 69 38 - 126 U/L  ? Total Bilirubin 0.7 0.3 - 1.2 mg/dL  ? GFR, Estimated 59 (L) >60 mL/min  ? Anion gap 9 5 - 15  ?Lipase, blood     Status: None  ? Collection Time: 07/15/21  6:21 PM  ?Result Value Ref Range  ? Lipase 51 11 - 51 U/L  ? ?No results found for this or any previous visit (from the past 240 hour(s)). ? ?Renal Function: ?Recent Labs  ?  07/15/21 ?1821  ?CREATININE 0.91  ? ?Estimated Creatinine Clearance: 29.4 mL/min (by C-G formula based on SCr of 0.91 mg/dL). ? ?Radiologic Imaging: ?CT ABDOMEN PELVIS W CONTRAST ? ?Result Date: 07/15/2021 ?CLINICAL DATA:  Pain right lower quadrant EXAM: CT ABDOMEN AND PELVIS WITH CONTRAST TECHNIQUE: Multidetector CT imaging of the abdomen and pelvis was performed using the standard protocol following bolus administration of  intravenous contrast. RADIATION DOSE REDUCTION: This exam was performed according to the departmental dose-optimization program which  includes automated exposure control, adjustment of the mA and/or kV according to patient size and/or use of iterative reconstruction technique. CONTRAST:  22mL OMNIPAQUE IOHEXOL 300 MG/ML  SOLN COMPARISON:  None. FINDINGS: Lower chest: Small linear densities seen in the lower lung fields suggesting scarring or subsegmental atelectasis. Coronary artery calcifications are seen. Hepatobiliary: There are few small low-density lesions in the liver largest measuring 1.4 cm in diameter. There is no dilation of intrahepatic bile ducts. Distal common bile duct in the head of the pancreas measures 9 mm. There is a diverticulum along the inner margin of second portion of duodenum. There are calcific densities in the fundus of the gallbladder suggesting gallstones. There is no wall thickening in gallbladder. Pancreas: There is prominence of pancreatic duct. No focal abnormality is seen in the pancreas. Spleen: Spleen is unremarkable. Adrenals/Urinary Tract: Adrenals are unremarkable. There is moderate right hydronephrosis. Right ureter is dilated. In the image 65 of series 2, there is 4 mm calcific density in the dependent portion of right side of the urinary bladder. There is no significant wall thickening in the urinary bladder. There is mild enhancement in the wall of right ureter. There is slight prominence of collecting system in the left kidney without dilation left ureter. There is 1.3 cm cyst in the lower pole of right kidney. In the delayed images, there is extravasation of contrast from the pelvocaliceal system in the right kidney along the anterior aspect of right renal pelvis and around the proximal course of right ureter. There is filling defect in the visualized proximal course of right ureter in the delayed pyelographic phase images. This may be unopacified urine in the lumen of  proximal right ureter. There is right perinephric fluid collection. Stomach/Bowel: Small hiatal hernia is seen. There is fluid in the lower thoracic esophagus lumen suggesting possible gastroesophageal refl

## 2021-07-15 NOTE — Anesthesia Procedure Notes (Signed)
Procedure Name: LMA Insertion ?Date/Time: 07/15/2021 11:36 PM ?Performed by: Epimenio Sarin, CRNA ?Pre-anesthesia Checklist: Patient identified, Emergency Drugs available, Suction available, Patient being monitored and Timeout performed ?Patient Re-evaluated:Patient Re-evaluated prior to induction ?Oxygen Delivery Method: Circle system utilized ?Preoxygenation: Pre-oxygenation with 100% oxygen ?Induction Type: IV induction ?LMA: LMA with gastric port inserted ?LMA Size: 3.0 ?Number of attempts: 1 ?Dental Injury: Teeth and Oropharynx as per pre-operative assessment  ? ? ? ? ?

## 2021-07-15 NOTE — Interval H&P Note (Signed)
History and Physical Interval Note: ? ?07/15/2021 ?11:19 PM ? ?Ruth Pierce  has presented today for surgery, with the diagnosis of right ureteral stone.  The various methods of treatment have been discussed with the patient and family. After consideration of risks, benefits and other options for treatment, the patient has consented to  Procedure(s): ?CYSTOSCOPY WITH RETROGRADE PYELOGRAM, URETEROSCOPY AND STENT PLACEMENT (Right) as a surgical intervention.  The patient's history has been reviewed, patient examined, no change in status, stable for surgery.  I have reviewed the patient's chart and labs.  Questions were answered to the patient's satisfaction.   ? ? ?Markeese Boyajian L Vallie Teters ? ? ?

## 2021-07-15 NOTE — Anesthesia Preprocedure Evaluation (Signed)
Anesthesia Evaluation  ?Patient identified by MRN, date of birth, ID band ?Patient awake ? ? ? ?Reviewed: ?Allergy & Precautions, NPO status , Patient's Chart, lab work & pertinent test results ? ?Airway ?Mallampati: II ? ?TM Distance: >3 FB ?Neck ROM: Full ? ? ? Dental ?no notable dental hx. ? ?  ?Pulmonary ?COPD,  ?  ?Pulmonary exam normal ? ? ? ? ? ? ? Cardiovascular ?hypertension, Pt. on medications ? ?Rhythm:Regular Rate:Normal ? ? ?  ?Neuro/Psych ? Headaches, Dementia   ? GI/Hepatic ?negative GI ROS, Neg liver ROS,   ?Endo/Other  ?negative endocrine ROS ? Renal/GU ?Right hydronephrosis   ? ?  ?Musculoskeletal ?negative musculoskeletal ROS ?(+)  ? Abdominal ?Normal abdominal exam  (+)   ?Peds ? Hematology ?negative hematology ROS ?(+)   ?Anesthesia Other Findings ? ? Reproductive/Obstetrics ? ?  ? ? ? ? ? ? ? ? ? ? ? ? ? ?  ?  ? ? ? ? ? ? ? ? ?Anesthesia Physical ?Anesthesia Plan ? ?ASA: 3 and emergent ? ?Anesthesia Plan: General  ? ?Post-op Pain Management:   ? ?Induction: Intravenous ? ?PONV Risk Score and Plan: 3 and Ondansetron, Dexamethasone and Treatment may vary due to age or medical condition ? ?Airway Management Planned: Mask and LMA ? ?Additional Equipment: None ? ?Intra-op Plan:  ? ?Post-operative Plan: Extubation in OR ? ?Informed Consent: I have reviewed the patients History and Physical, chart, labs and discussed the procedure including the risks, benefits and alternatives for the proposed anesthesia with the patient or authorized representative who has indicated his/her understanding and acceptance.  ? ? ? ?Dental advisory given ? ?Plan Discussed with: CRNA ? ?Anesthesia Plan Comments:   ? ? ? ? ? ? ?Anesthesia Quick Evaluation ? ?

## 2021-07-15 NOTE — ED Notes (Signed)
[facility] called for transport. 

## 2021-07-15 NOTE — ED Provider Notes (Signed)
?[facility] ?Provider Note ? ?CSN: [id number] ?Arrival date & time: 07/15/21 1746 ? ?Chief Complaint(s) ?No chief complaint on file. ? ?HPI ?Ruth Pierce is a 88 y.o. female with PMH COPD, dementia, HTN who presents emergency department for evaluation of abdominal pain.  Patient states that she was on a walk approximately 2 hours prior to arrival and had acute onset right lower quadrant pain.  She states that the pain has gradually worsened in intensity and she has vomited.  Denies chest pain, shortness of breath, headache, fever or other systemic symptoms.  Patient arrives hypertensive to 211/105 but has not taken her home blood pressure medication today. ? ? ?Past Medical History ?Past Medical History:  ?Diagnosis Date  ? COPD (chronic obstructive pulmonary disease) ([place])   ? Dementia ([place])   ? Hypertension   ? ?Patient Active Problem List  ? Diagnosis Date Noted  ? Vascular dementia ([place]) 07/14/2020  ? HTN (hypertension) 07/14/2020  ? Vitamin D deficiency 07/14/2020  ? Giant cell arteritis ([place]) 07/14/2020  ? Headache 07/14/2020  ? ?Home Medication(s) ?Prior to Admission medications   ?Medication Sig Start Date End Date Taking? Authorizing Provider  ?albuterol (VENTOLIN HFA) 108 (90 Base) MCG/ACT inhaler Inhale 1-2 puffs into the lungs every 4 (four) hours as needed for wheezing or shortness of breath. 07/18/20   Long, Wonda Olds, MD  ?losartan (COZAAR) 50 MG tablet Take 1 tablet by mouth daily. 05/02/20   [provider]  ?Vitamin D, Ergocalciferol, (DRISDOL) 1.25 MG (50000 UNIT) CAPS capsule Take 50,000 Units by mouth every 7 (seven) days. 05/02/20   [provider]  ?                                                                                                                                  ?Past Surgical History ?Past Surgical History:  ?Procedure Laterality Date  ? TOOTH EXTRACTION    ? ?Family History ?No family history on file. ? ?Social History ?Social History   ? ?Tobacco Use  ? Smoking status: Never  ? Smokeless tobacco: Never  ?Substance Use Topics  ? Drug use: Never  ? ?Allergies ?Patient has no known allergies. ? ?Review of Systems ?Review of Systems  ?Gastrointestinal:  Positive for abdominal pain and vomiting.  ? ?Physical Exam ?Vital Signs  ?I have reviewed the triage vital signs ?BP (!) 211/105 (BP Location: Left Arm)   Pulse 70   Temp 98 ?F (36.7 ?C) (Oral)   Resp 16   Ht 5\' 2"  (1.575 m)   Wt 47.2 kg   SpO2 95%   BMI 19.02 kg/m?  ? ?Physical Exam ?Vitals and nursing note reviewed.  ?Constitutional:   ?   General: She is not in acute distress. ?   Appearance: She is well-developed.  ?HENT:  ?   Head: Normocephalic and atraumatic.  ?Eyes:  ?   Conjunctiva/sclera: Conjunctivae normal.  ?Cardiovascular:  ?  Rate and Rhythm: Normal rate and regular rhythm.  ?   Heart sounds: No murmur heard. ?Pulmonary:  ?   Effort: Pulmonary effort is normal. No respiratory distress.  ?   Breath sounds: Normal breath sounds.  ?Abdominal:  ?   Palpations: Abdomen is soft.  ?   Tenderness: There is abdominal tenderness.  ?Musculoskeletal:     ?   General: No swelling.  ?   Cervical back: Neck supple.  ?Skin: ?   General: Skin is warm and dry.  ?   Capillary Refill: Capillary refill takes less than 2 seconds.  ?Neurological:  ?   Mental Status: She is alert.  ?Psychiatric:     ?   Mood and Affect: Mood normal.  ? ? ?ED Results and Treatments ?Labs ?(all labs ordered are listed, but only abnormal results are displayed) ?Labs Reviewed  ?CBC WITH DIFFERENTIAL/PLATELET  ?COMPREHENSIVE METABOLIC PANEL  ?LIPASE, BLOOD  ?URINALYSIS, ROUTINE W REFLEX MICROSCOPIC  ?                                                                                                                       ? ?Radiology ?No results found. ? ?Pertinent labs & imaging results that were available during my care of the patient were reviewed by me and considered in my medical decision making (see MDM for  details). ? ?Medications Ordered in ED ?Medications - No data to display                                                               ?                                                                    ?Procedures ?Procedures ? ?(including critical care time) ? ?Medical Decision Making / ED Course ? ? ?This patient presents to the ED for concern of abdominal pain, this involves an extensive number of treatment options, and is a complaint that carries with it a high risk of complications and morbidity.  The differential diagnosis includes intra-abdominal abscess, AAA, nephrolithiasis, cystitis, small bowel obstruction ? ?MDM: ?Department for evaluation of abdominal pain.  Physical exam with tenderness in the right lower quadrant but is otherwise unremarkable.  Laboratory evaluation largely unremarkable with no significant creatinine elevation or evidence of urinary infection.  CT abdomen pelvis with moderate right hydronephrosis with a 4 mm calcific density concerning for a right UVJ calculus and associated contrast extravasation possibly due to calyceal rupture.  CT abdomen pelvis also showing possible ileus and  possible prolapse of the rectum into the left perineal region.  I went back and reevaluated the patient and she does not have external evidence of a rectal prolapse.  Urology consulted who recommends [facility] stent[facility].  Patient then transferred to [facility] ? ? ?Additional history obtained: ?-Additional history obtained from family members ?-External records from outside source obtained and reviewed including: Chart review including previous notes, labs, imaging, consultation notes ? ? ?Lab Tests: ?-I ordered, reviewed, and interpreted labs.   ?The pertinent results include:   ?Labs Reviewed  ?CBC WITH DIFFERENTIAL/PLATELET  ?COMPREHENSIVE METABOLIC PANEL  ?LIPASE, BLOOD  ?URINALYSIS, ROUTINE W REFLEX MICROSCOPIC  ?  ? ? ?Imaging Studies ordered: ?I ordered imaging studies including  CTAP ?I independently visualized and interpreted imaging. ?I agree with the radiologist interpretation ? ? ?Medicines ordered and prescription drug management: ?No orders of the defined types were placed in this encounter. ?  ?-I have reviewed the patients home medicines and have made adjustments as needed ? ?Critical interventions ?none ? ?Consultations Obtained: ?I requested consultation with the urologist,  and discussed lab and imaging findings as well as pertinent plan - they recommend: transfer to [place] ? ? ?Cardiac Monitoring: ?The patient was maintained on a cardiac monitor.  I personally viewed and interpreted the cardiac monitored which showed an underlying rhythm of: NSr ? ?Social Determinants of Health:  ?Factors impacting patients care include: none ? ? ?Reevaluation: ?After the interventions noted above, I reevaluated the patient and found that they have :improved ? ?Co morbidities that complicate the patient evaluation ? ?Past Medical History:  ?Diagnosis Date  ? COPD (chronic obstructive pulmonary disease) ([place])   ? Dementia ([place])   ? Hypertension   ?  ? ? ?Dispostion: ?I considered admission for this patient, and due to calyceal rupture and obstruction patient will be transferred to [facility] for urgent procedure. ? ? ? ? ?Final Clinical Impression(s) / ED Diagnoses ?Final diagnoses:  ?None  ? ? ? ?@PCDICTATION @ ? ?  ?Teressa Lower, MD ?07/15/21 2126 ? ?

## 2021-07-15 NOTE — ED Notes (Signed)
Report given to [facility] 

## 2021-07-15 NOTE — ED Notes (Signed)
Pt to CT

## 2021-07-15 NOTE — ED Notes (Signed)
Bedsheets changed. Pt urinated multiple times. Did not tell staff.  ?

## 2021-07-15 NOTE — H&P (View-Only) (Signed)
Urology Consult  ? ?History of Present Illness: Ruth Pierce is a 88 y.o. who initially presented to the ED earlier this evening with acute RLQ pain. A CT scan was obatined which shows a R distal 4-5 mm UVJ calcification with associated hydronephrosis. On the delayed imaging, there is extravasation apparently form the collecting system ? ?She had one episode of emesis earlier. Upon arrival to the ED her SBP was >200 but this has improved.  ? ?Ruth Pierce is afebrile and has no white count. Her Cr is 0.91, which appears to be around her baseline. Her UA is unremarkable as well.  ? ?Past Medical History:  ?Diagnosis Date  ? COPD (chronic obstructive pulmonary disease) ([place])   ? Dementia ([place])   ? Hypertension   ? Vascular dementia ([place])   ? ? ?Past Surgical History:  ?Procedure Laterality Date  ? TOOTH EXTRACTION    ? ? ? ?Current Hospital Medications: ? ?Home meds:  ?No current facility-administered medications on file prior to encounter.  ? ?Current Outpatient Medications on File Prior to Encounter  ?Medication Sig Dispense Refill  ? albuterol (VENTOLIN HFA) 108 (90 Base) MCG/ACT inhaler Inhale 1-2 puffs into the lungs every 4 (four) hours as needed for wheezing or shortness of breath. 6.7 g 0  ? aspirin 81 MG EC tablet Take 81 mg by mouth daily as needed for pain. Swallow whole.    ? ipratropium-albuterol (DUONEB) 0.5-2.5 (3) MG/3ML SOLN Inhale 3 mLs into the lungs daily as needed (for shorntess of breath).    ? losartan (COZAAR) 50 MG tablet Take 50 mg by mouth every evening.    ? Vitamin D, Ergocalciferol, (DRISDOL) 1.25 MG (50000 UNIT) CAPS capsule Take 50,000 Units by mouth every 7 (seven) days. Sunday    ?  ? ?Scheduled Meds: ? [MAR Hold] losartan  50 mg Oral Daily  ? ?Continuous Infusions: ?PRN Meds:. ? ?Allergies: No Known Allergies ? ?No family history on file. ? ?Social History:  reports that she has never smoked. She has never used smokeless tobacco. She reports that she does not use drugs. No history on  file for alcohol use. ? ?ROS: ?A complete review of systems was performed.  All systems are negative except for pertinent findings as noted. ? ?Physical Exam:  ?Vital signs in last 24 hours: ?Temp:  [97.9 ?F (36.6 ?C)-98.8 ?F (37.1 ?C)] 98.8 ?F (37.1 ?C) (04/29 2258) ?Pulse Rate:  [65-74] 71 (04/29 2258) ?Resp:  [12-25] 15 (04/29 2258) ?BP: (139-214)/(77-111) 151/81 (04/29 2258) ?SpO2:  [92 %-98 %] 93 % (04/29 2258) ?Weight:  [47.2 kg] 47.2 kg (04/29 1806) ?Constitutional:  Alert and oriented, No acute distress ?Cardiovascular: Regular rate and rhythm ?Respiratory: Normal respiratory effort, Lungs clear bilaterally ?GI: Abdomen is soft, nontender, nondistended, no abdominal masses ?GU: No CVA tenderness ?Neurologic: Grossly intact, no focal deficits ?Psychiatric: Normal mood and affect ? ?Laboratory Data:  ?Recent Labs  ?  07/15/21 ?[id number]  ?WBC 8.2  ?HGB 13.5  ?HCT 42.2  ?PLT 235  ? ? ?Recent Labs  ?  07/15/21 ?1821  ?NA 138  ?K 4.2  ?CL 104  ?GLUCOSE 118*  ?BUN 23  ?CALCIUM 9.0  ?CREATININE 0.91  ? ? ? ?Results for orders placed or performed during the hospital encounter of 07/15/21 (from the past 24 hour(s))  ?Urinalysis, Routine w reflex microscopic Urine, Clean Catch     Status: Abnormal  ? Collection Time: 07/15/21  6:11 PM  ?Result Value Ref Range  ? Color, Urine YELLOW  YELLOW  ? APPearance HAZY (A) CLEAR  ? Specific Gravity, Urine 1.009 1.005 - 1.030  ? pH 7.0 5.0 - 8.0  ? Glucose, UA NEGATIVE NEGATIVE mg/dL  ? Hgb urine dipstick NEGATIVE NEGATIVE  ? Bilirubin Urine NEGATIVE NEGATIVE  ? Ketones, ur NEGATIVE NEGATIVE mg/dL  ? Protein, ur NEGATIVE NEGATIVE mg/dL  ? Nitrite NEGATIVE NEGATIVE  ? Leukocytes,Ua TRACE (A) NEGATIVE  ? RBC / HPF 0-5 0 - 5 RBC/hpf  ? WBC, UA 0-5 0 - 5 WBC/hpf  ? Bacteria, UA MANY (A) NONE SEEN  ? Squamous Epithelial / LPF 0-5 0 - 5  ? Mucus PRESENT   ?CBC with Differential     Status: None  ? Collection Time: 07/15/21  6:21 PM  ?Result Value Ref Range  ? WBC 8.2 4.0 - 10.5 K/uL  ? RBC  4.34 3.87 - 5.11 MIL/uL  ? Hemoglobin 13.5 12.0 - 15.0 g/dL  ? HCT 42.2 36.0 - 46.0 %  ? MCV 97.2 80.0 - 100.0 fL  ? MCH 31.1 26.0 - 34.0 pg  ? MCHC 32.0 30.0 - 36.0 g/dL  ? RDW 13.7 11.5 - 15.5 %  ? Platelets 235 150 - 400 K/uL  ? nRBC 0.0 0.0 - 0.2 %  ? Neutrophils Relative % 78 %  ? Neutro Abs 6.4 1.7 - 7.7 K/uL  ? Lymphocytes Relative 14 %  ? Lymphs Abs 1.2 0.7 - 4.0 K/uL  ? Monocytes Relative 6 %  ? Monocytes Absolute 0.5 0.1 - 1.0 K/uL  ? Eosinophils Relative 1 %  ? Eosinophils Absolute 0.1 0.0 - 0.5 K/uL  ? Basophils Relative 0 %  ? Basophils Absolute 0.0 0.0 - 0.1 K/uL  ? Immature Granulocytes 1 %  ? Abs Immature Granulocytes 0.04 0.00 - 0.07 K/uL  ?Comprehensive metabolic panel     Status: Abnormal  ? Collection Time: 07/15/21  6:21 PM  ?Result Value Ref Range  ? Sodium 138 135 - 145 mmol/L  ? Potassium 4.2 3.5 - 5.1 mmol/L  ? Chloride 104 98 - 111 mmol/L  ? CO2 25 22 - 32 mmol/L  ? Glucose, Bld 118 (H) 70 - 99 mg/dL  ? BUN 23 8 - 23 mg/dL  ? Creatinine, Ser 0.91 0.44 - 1.00 mg/dL  ? Calcium 9.0 8.9 - 10.3 mg/dL  ? Total Protein 7.4 6.5 - 8.1 g/dL  ? Albumin 4.0 3.5 - 5.0 g/dL  ? AST 22 15 - 41 U/L  ? ALT 8 0 - 44 U/L  ? Alkaline Phosphatase 69 38 - 126 U/L  ? Total Bilirubin 0.7 0.3 - 1.2 mg/dL  ? GFR, Estimated 59 (L) >60 mL/min  ? Anion gap 9 5 - 15  ?Lipase, blood     Status: None  ? Collection Time: 07/15/21  6:21 PM  ?Result Value Ref Range  ? Lipase 51 11 - 51 U/L  ? ?No results found for this or any previous visit (from the past 240 hour(s)). ? ?Renal Function: ?Recent Labs  ?  07/15/21 ?1821  ?CREATININE 0.91  ? ?Estimated Creatinine Clearance: 29.4 mL/min (by C-G formula based on SCr of 0.91 mg/dL). ? ?Radiologic Imaging: ?CT ABDOMEN PELVIS W CONTRAST ? ?Result Date: 07/15/2021 ?CLINICAL DATA:  Pain right lower quadrant EXAM: CT ABDOMEN AND PELVIS WITH CONTRAST TECHNIQUE: Multidetector CT imaging of the abdomen and pelvis was performed using the standard protocol following bolus administration of  intravenous contrast. RADIATION DOSE REDUCTION: This exam was performed according to the departmental dose-optimization program which  includes automated exposure control, adjustment of the mA and/or kV according to patient size and/or use of iterative reconstruction technique. CONTRAST:  39mL OMNIPAQUE IOHEXOL 300 MG/ML  SOLN COMPARISON:  None. FINDINGS: Lower chest: Small linear densities seen in the lower lung fields suggesting scarring or subsegmental atelectasis. Coronary artery calcifications are seen. Hepatobiliary: There are few small low-density lesions in the liver largest measuring 1.4 cm in diameter. There is no dilation of intrahepatic bile ducts. Distal common bile duct in the head of the pancreas measures 9 mm. There is a diverticulum along the inner margin of second portion of duodenum. There are calcific densities in the fundus of the gallbladder suggesting gallstones. There is no wall thickening in gallbladder. Pancreas: There is prominence of pancreatic duct. No focal abnormality is seen in the pancreas. Spleen: Spleen is unremarkable. Adrenals/Urinary Tract: Adrenals are unremarkable. There is moderate right hydronephrosis. Right ureter is dilated. In the image 65 of series 2, there is 4 mm calcific density in the dependent portion of right side of the urinary bladder. There is no significant wall thickening in the urinary bladder. There is mild enhancement in the wall of right ureter. There is slight prominence of collecting system in the left kidney without dilation left ureter. There is 1.3 cm cyst in the lower pole of right kidney. In the delayed images, there is extravasation of contrast from the pelvocaliceal system in the right kidney along the anterior aspect of right renal pelvis and around the proximal course of right ureter. There is filling defect in the visualized proximal course of right ureter in the delayed pyelographic phase images. This may be unopacified urine in the lumen of  proximal right ureter. There is right perinephric fluid collection. Stomach/Bowel: Small hiatal hernia is seen. There is fluid in the lower thoracic esophagus lumen suggesting possible gastroesophageal refl

## 2021-07-15 NOTE — ED Notes (Signed)
[facility] here 

## 2021-07-15 NOTE — ED Provider Notes (Signed)
Care of patient assumed in transfer from Dr. Audrie Lia.  This patient had a calyceal rupture secondary to obstructive ureterolithiasis.  She will are stenting by urology. ?Physical Exam  ?BP (!) 159/77   Pulse 67   Temp 98 ?F (36.7 ?C) (Oral)   Resp 16   Ht 5\' 2"  (1.575 m)   Wt 47.2 kg   SpO2 93%   BMI 19.02 kg/m?  ? ?Physical Exam ?Constitutional:   ?   Appearance: She is well-developed.  ?HENT:  ?   Head: Normocephalic and atraumatic.  ?Eyes:  ?   General: No scleral icterus. ?   Extraocular Movements: Extraocular movements intact.  ?Cardiovascular:  ?   Rate and Rhythm: Normal rate and regular rhythm.  ?Pulmonary:  ?   Effort: Pulmonary effort is normal.  ?Skin: ?   General: Skin is warm and dry.  ?Neurological:  ?   General: No focal deficit present.  ?   Mental Status: She is alert.  ?Psychiatric:     ?   Mood and Affect: Mood normal.     ?   Behavior: Behavior normal.  ? ? ?Procedures  ?Procedures ? ?ED Course / MDM  ?  ?Medical Decision Making ?Amount and/or Complexity of Data Reviewed ?Labs: ordered. ?Radiology: ordered. ? ?Risk ?Prescription drug management. ? ? ?On assessment, patient resting comfortably.  Vital signs are normal.  She denies any significant pain at this time.  As needed fentanyl was ordered.  Patient was kept NPO.  Urologist was notified that patient was present in the ED.  Patient was taken to preop for planned procedure. ? ? ? ? ?  ? , MD ?07/16/21 1628 ? ?

## 2021-07-15 NOTE — Discharge Instructions (Addendum)
[facility] ?[phone] ?Post Ureteroscopy With or Without Stent Instructions ? ?Definitions: ? ?Ureter: The duct that transports urine from the kidney to the bladder. ?Stent:   A plastic hollow tube that is placed into the ureter, from the kidney to the bladder to prevent the ureter from swelling shut. ? ?GENERAL INSTRUCTIONS: ? ?Despite the fact that no skin incisions were used, the area around the ureter and bladder is raw and irritated. The stent is a foreign body which will further irritate the bladder wall. This irritation is manifested by increased frequency of urination, both day and night, and by an increase in the urge to urinate. In some, the urge to urinate is present almost always. Sometimes the urge is strong enough that you may not be able to stop yourself from urinating. The only real cure is to remove the stent and then give time for the bladder wall to heal which can't be done until the danger of the ureter swelling shut has passed, which varies. ? ?You may see some blood in your urine while the stent is in place and a few days afterwards. Do not be alarmed, even if the urine was clear for a while. Get off your feet and drink lots of fluids until clearing occurs. If you start to pass clots or don't improve, call us. ? ?DIET: ?You may return to your normal diet immediately. Because of the raw surface of your bladder, alcohol, spicy foods, acid type foods and drinks with caffeine may cause irritation or frequency and should be used in moderation. To keep your urine flowing freely and to avoid constipation, drink plenty of fluids during the day ( 8-10 glasses ). ?Tip: Avoid cranberry juice because it is very acidic. ? ?ACTIVITY: ?Your physical activity doesn't need to be restricted. However, if you are very active, you may see some blood in your urine. We suggest that you reduce your activity under these circumstances until the bleeding has stopped. ? ?BOWELS: ?It is important to  keep your bowels regular during the postoperative period. Straining with bowel movements can cause bleeding. A bowel movement every other day is reasonable. Use a mild laxative if needed, such as Milk of Magnesia 2-3 tablespoons, or 2 Dulcolax tablets. Call if you continue to have problems. If you have been taking narcotics for pain, before, during or after your surgery, you may be constipated. Take a laxative if necessary. ? ? ?MEDICATION: ?You should resume your pre-surgery medications unless told not to. In addition you will often be given an antibiotic to prevent infection and likely several as needed medications for stent related discomfort. These should be taken as prescribed until the bottles are finished unless you are having an unusual reaction to one of the drugs. ? ?PROBLEMS YOU SHOULD REPORT TO [place]: ?Fevers over 100.5 Fahrenheit. ?Heavy bleeding, or clots ( See above notes about blood in urine ). ?Inability to urinate. ?Drug reactions ( hives, rash, nausea, vomiting, diarrhea ). ?Severe burning or pain with urination that is not improving. ? ? ?Post Anesthesia Home Care Instructions ? ?Activity: ?Get plenty of rest for the remainder of the day. A responsible individual must stay with you for 24 hours following the procedure.  ?For the next 24 hours, DO NOT: ?-Drive a car ?-Advertising copywriter ?-Drink alcoholic beverages ?-Take any medication unless instructed by your physician ?-Make any legal decisions or sign important papers. ? ?Meals: ?Start with liquid foods such as gelatin or soup. Progress to regular foods as tolerated. Avoid  greasy, spicy, heavy foods. If nausea and/or vomiting occur, drink only clear liquids until the nausea and/or vomiting subsides. Call your physician if vomiting continues. ? ?Special Instructions/Symptoms: ?Your throat may feel dry or sore from the anesthesia or the breathing tube placed in your throat during surgery. If this causes discomfort, gargle with warm salt water. The  discomfort should disappear within 24 hours. ? ?If you had a scopolamine patch placed behind your ear for the management of post- operative nausea and/or vomiting: ? ?1. The medication in the patch is effective for 72 hours, after which it should be removed.  Wrap patch in a tissue and discard in the trash. Wash hands thoroughly with soap and water. ?2. You may remove the patch earlier than 72 hours if you experience unpleasant side effects which may include dry mouth, dizziness or visual disturbances. ?3. Avoid touching the patch. Wash your hands with soap and water after contact with the patch. ?    ? ? ? ? ? ?

## 2021-07-15 NOTE — ED Triage Notes (Signed)
Pt arrived via [facility] c/o abdominal pain that started a couple of hours ago. Per EMS, pt stated she believes she over-exerted herself while she went for a walk. Pt became nauseous in the ambulance. Pt was also Hypertensive upon EMS arrival 201/100 ?

## 2021-07-15 NOTE — ED Notes (Signed)
Pt to go to [facility] ED. Secretary to call [facility] for transport ?

## 2021-07-16 ENCOUNTER — Emergency Department (HOSPITAL_COMMUNITY): Payer: Medicare Other

## 2021-07-16 DIAGNOSIS — N1330 Unspecified hydronephrosis: Secondary | ICD-10-CM | POA: Diagnosis not present

## 2021-07-16 DIAGNOSIS — N132 Hydronephrosis with renal and ureteral calculous obstruction: Secondary | ICD-10-CM | POA: Diagnosis not present

## 2021-07-16 DIAGNOSIS — N201 Calculus of ureter: Secondary | ICD-10-CM | POA: Diagnosis not present

## 2021-07-16 MED ORDER — TRAMADOL HCL 50 MG PO TABS: 50.0000 mg | ORAL_TABLET | Freq: Three times a day (TID) | ORAL | 0 refills | Status: AC | PRN

## 2021-07-16 MED ORDER — CEPHALEXIN 500 MG PO CAPS: 500.0000 mg | ORAL_CAPSULE | Freq: Two times a day (BID) | ORAL | 0 refills | Status: DC

## 2021-07-16 MED ORDER — PROPOFOL 10 MG/ML IV BOLUS
INTRAVENOUS | Status: AC
  Filled 2021-07-16: qty 20

## 2021-07-16 MED ORDER — ONDANSETRON HCL 4 MG/2ML IJ SOLN
INTRAMUSCULAR | Status: AC
  Filled 2021-07-16: qty 2

## 2021-07-16 MED ORDER — FENTANYL CITRATE (PF) 100 MCG/2ML IJ SOLN
INTRAMUSCULAR | Status: AC
Start: 2021-07-16 — End: ?
  Filled 2021-07-16: qty 2

## 2021-07-16 MED ORDER — DEXAMETHASONE SODIUM PHOSPHATE 10 MG/ML IJ SOLN
INTRAMUSCULAR | Status: AC
  Filled 2021-07-16: qty 1

## 2021-07-16 MED ORDER — SOLIFENACIN SUCCINATE 5 MG PO TABS: 5.0000 mg | ORAL_TABLET | Freq: Every day | ORAL | 1 refills | Status: DC | PRN

## 2021-07-16 MED ORDER — LIDOCAINE HCL (PF) 2 % IJ SOLN
INTRAMUSCULAR | Status: AC
  Filled 2021-07-16: qty 5

## 2021-07-16 MED ORDER — CELECOXIB 100 MG PO CAPS: 100.0000 mg | ORAL_CAPSULE | Freq: Two times a day (BID) | ORAL | 1 refills | Status: DC | PRN

## 2021-07-16 MED ORDER — TAMSULOSIN HCL 0.4 MG PO CAPS: 0.4000 mg | ORAL_CAPSULE | Freq: Every day | ORAL | 0 refills | Status: DC

## 2021-07-16 NOTE — Anesthesia Postprocedure Evaluation (Signed)
Anesthesia Post Note ? ?Patient: Ruth Pierce ? ?Procedure(s) Performed: CYSTOSCOPY WITH RETROGRADE PYELOGRAM, URETEROSCOPY AND STENT PLACEMENT (Right) ? ?  ? ?Patient location during evaluation: PACU ?Anesthesia Type: General ?Level of consciousness: awake and alert ?Pain management: pain level controlled ?Vital Signs Assessment: post-procedure vital signs reviewed and stable ?Respiratory status: spontaneous breathing, nonlabored ventilation, respiratory function stable and patient connected to nasal cannula oxygen ?Cardiovascular status: blood pressure returned to baseline and stable ?Postop Assessment: no apparent nausea or vomiting ?Anesthetic complications: no ? ? ?No notable events documented. ? ?Last Vitals:  ?Vitals:  ? 07/16/21 0035 07/16/21 0040  ?BP: (!) 174/81 (!) 174/81  ?Pulse: 66   ?Resp: 20   ?Temp: (!) 36.4 ?C   ?SpO2: 97%   ?  ?Last Pain:  ?Vitals:  ? 07/16/21 0035  ?TempSrc:   ?PainSc: 0-No pain  ? ? ?  ?  ?  ?  ?  ?  ? ?Nelle Don Earle Burson ? ? ? ? ?

## 2021-07-16 NOTE — Op Note (Signed)
Operative Note ? ?Preoperative diagnosis:  ?1.  R ureteral stone ?2. R hydronephrosis ?3. Right abdominal pain ?4. Possible ruptured calyx  ? ?Postoperative diagnosis: ?1.  same ? ?Procedure(s): ?1.  Right ureteroscopy ?2. right retrograde pyelogram with interpretation ?3. right ureteral stent placement ?4. Fluoroscopy <1 hour with intraoperative interpretation ? ?Surgeon: Donald Pore, MD ? ?Assistants:  None ? ?Anesthesia:  General ? ?Complications:  None ? ?EBL:  minimal ? ?Specimens: ?1. stone ? ?Drains/Catheters: ?1.  Right 6Fr x 22cm ureteral stent ? ?Intraoperative findings:   ?Cystoscopy demonstrated no suspicious lesions, masses, or other pathology. A small stone was identified ?Right Retrograde pyelogram demonstrated hydronephrosis, there was possibly a small amount of extrav around the level of the UPJ but contrast mostly was contained in the collecting system ?Successful right ureteral stent placement with curl in the renal pelvis and bladder respectively. ? ?Indication:  Ruth Pierce is a 88 y.o. female with the above diagnoses. After reviewing the management options for treatment, she elected to proceed with the above surgical procedure(s). We have discussed the potential benefits and risks of the procedure, side effects of the proposed treatment, the likelihood of the patient achieving the goals of the procedure, and any potential problems that might occur during the procedure or recuperation. Informed consent has been obtained. ? ?Description of procedure: ?The patient was taken to the operating room and general anesthesia was induced.  The patient was placed in the dorsal lithotomy position, prepped and draped in the usual sterile fashion, and preoperative antibiotics were administered. A preoperative time-out was performed.  ? ?Cystourethroscopy was performed.  The patient?s urethra was examined and was normal. The bladder was then systematically examined in its entirety. There was no evidence for  any bladder tumors, or other mucosal pathology. There was a small stone identified and removed to be sent for specimen.  ? ?Attention then turned to the right ureteral orifice. A 0.038 zip wire was passed through the right orifice and over the wire a 5 Fr open ended catheter was inserted and passed into the ureter. Omnipaque contrast was injected through the ureteral catheter and a retrograde pyelogram was performed with findings as dictated above. The wire was then replaced and the open ended catheter was removed.  ? ?I then carefully inserted a semi rigid ureteroscope. No stone was identified in the distal ureter. I advanced it to the proximal ureter and no stone was identified.  ? ?A 6Fr x 22cm ureteral stent was then advanced over the wire. The stent was positioned appropriately under fluoroscopic guidance.  The wire was then removed with an adequate stent curl noted in the renal pelvis as well as in the bladder. ? ?The bladder was then emptied and the procedure ended.  The patient appeared to tolerate the procedure well and without complications.  The patient was able to be awakened and transferred to the recovery unit in satisfactory condition.  ? ?Plan:   ?D/c from PACU ?Patient will f/u in 1 month for stent removal ?I will prescribe stent discomfort medications and a short course of abx ? ?Dona Klemann L Chasya Zenz ?[facility]  ?07/16/21 ? ? ?

## 2021-07-16 NOTE — Transfer of Care (Signed)
Immediate Anesthesia Transfer of Care Note ? ?Patient: Ruth Pierce ? ?Procedure(s) Performed: CYSTOSCOPY WITH RETROGRADE PYELOGRAM, URETEROSCOPY AND STENT PLACEMENT (Right) ? ?Patient Location: PACU ? ?Anesthesia Type:General ? ?Level of Consciousness: drowsy and patient cooperative ? ?Airway & Oxygen Therapy: Patient Spontanous Breathing and Patient connected to face mask oxygen ? ?Post-op Assessment: Report given to RN and Post -op Vital signs reviewed and stable ? ?Post vital signs: Reviewed and stable ? ?Last Vitals:  ?Vitals Value Taken Time  ?BP 169/86 07/16/21 0013  ?Temp 36.2 ?C 07/16/21 0013  ?Pulse 70 07/16/21 0015  ?Resp 17 07/16/21 0015  ?SpO2 98 % 07/16/21 0015  ?Vitals shown include unvalidated device data. ? ?Last Pain:  ?Vitals:  ? 07/15/21 2234  ?TempSrc: Oral  ?PainSc:   ?   ? ?Patients Stated Pain Goal: 0 (07/15/21 1804) ? ?Complications: No notable events documented. ?

## 2021-07-17 ENCOUNTER — Encounter (HOSPITAL_COMMUNITY): Payer: Self-pay | Admitting: Urology

## 2021-07-18 DIAGNOSIS — Z87442 Personal history of urinary calculi: Secondary | ICD-10-CM | POA: Diagnosis not present

## 2021-08-03 ENCOUNTER — Ambulatory Visit
Admission: EM | Admit: 2021-08-03 | Discharge: 2021-08-03 | Disposition: A | Discharge status: Home / Self Care | Payer: Medicare Other | Attending: Nurse Practitioner | Admitting: Nurse Practitioner

## 2021-08-03 DIAGNOSIS — R319 Hematuria, unspecified: Secondary | ICD-10-CM | POA: Diagnosis not present

## 2021-08-03 DIAGNOSIS — N3001 Acute cystitis with hematuria: Secondary | ICD-10-CM | POA: Diagnosis not present

## 2021-08-03 LAB — POCT URINALYSIS DIP (MANUAL ENTRY)
Bilirubin, UA: NEGATIVE
Glucose, UA: NEGATIVE mg/dL
Nitrite, UA: POSITIVE — AB
Protein Ur, POC: >=300 mg/dL — AB
Spec Grav, UA: >=1.03 — AB (ref 1.010–1.025)
Urobilinogen, UA: 1 E.U./dL
pH, UA: 5 (ref 5.0–8.0)

## 2021-08-03 MED ORDER — AMOXICILLIN-POT CLAVULANATE 875-125 MG PO TABS: 1.0000 | ORAL_TABLET | Freq: Two times a day (BID) | ORAL | 0 refills | Status: DC

## 2021-08-03 NOTE — ED Triage Notes (Signed)
Per daughter, pt urine looks red like she had blood in the urine; blood in diaper. Per daughter pt had surgery for kidney stone 3 weeks ago.

## 2021-08-03 NOTE — ED Provider Notes (Signed)
[facility]    CSN: [id number] Arrival date & time: 08/03/21  1458      History   Chief Complaint Chief Complaint  Patient presents with   Vaginal Bleeding    HPI Ruth Pierce is a 88 y.o. female.   The patient is a 88 year old female who presents with her daughter for complaints of bleeding.  The patient's daughter states that the patient was at the [facility] center earlier today when the staff called her and told her that the patient was having bleeding with urination.  The daughter states they later called her back and said that they found blood in her diaper.  The patient's daughter states approximately 3 weeks ago the patient had a procedure at [facility] that required a ureteral stent.  The patient's daughter states that she was informed that the patient may experience hematoma after the procedure.  The patient's daughter states that the stent is scheduled to be removed during the first week of June.  The patient denies fever, chills, abdominal pain, dysuria, or low back pain.  The patient is alert and oriented to self and her surroundings.  The history is provided by the patient and a relative.   Past Medical History:  Diagnosis Date   COPD (chronic obstructive pulmonary disease) (HCC)    Dementia (HCC)    Hypertension    Vascular dementia [facility])     Patient Active Problem List   Diagnosis Date Noted   Vascular dementia (HCC) 07/14/2020   HTN (hypertension) 07/14/2020   Vitamin D deficiency 07/14/2020   Giant cell arteritis (HCC) 07/14/2020   Headache 07/14/2020    Past Surgical History:  Procedure Laterality Date   CYSTOSCOPY WITH RETROGRADE PYELOGRAM, URETEROSCOPY AND STENT PLACEMENT Right 07/15/2021   Procedure: CYSTOSCOPY WITH RETROGRADE PYELOGRAM, URETEROSCOPY AND STENT PLACEMENT;  Surgeon: Despina Arias, MD;  Location: [facility];  Service: Urology;  Laterality: Right;   TOOTH EXTRACTION      OB History   No obstetric history on file.      Home  Medications    Prior to Admission medications   Medication Sig Start Date End Date Taking? Authorizing Provider  amoxicillin-clavulanate (AUGMENTIN) 875-125 MG tablet Take 1 tablet by mouth every 12 (twelve) hours. 08/03/21  Yes Tyree Vandruff-Warren, Sadie Haber, NP  albuterol (VENTOLIN HFA) 108 (90 Base) MCG/ACT inhaler Inhale 1-2 puffs into the lungs every 4 (four) hours as needed for wheezing or shortness of breath. 07/18/20   Long, Arlyss Repress, MD  aspirin 81 MG EC tablet Take 81 mg by mouth daily as needed for pain. Swallow whole.    [provider]  celecoxib (CELEBREX) 100 MG capsule Take 1 capsule (100 mg total) by mouth 2 (two) times daily as needed for moderate pain. 07/16/21 07/16/22  Despina Arias, MD  cephALEXin (KEFLEX) 500 MG capsule Take 1 capsule (500 mg total) by mouth 2 (two) times daily. 07/16/21   Despina Arias, MD  ipratropium-albuterol (DUONEB) 0.5-2.5 (3) MG/3ML SOLN Inhale 3 mLs into the lungs daily as needed (for shorntess of breath). 06/19/21   [provider]  losartan (COZAAR) 50 MG tablet Take 50 mg by mouth every evening. 05/02/20   [provider]  solifenacin (VESICARE) 5 MG tablet Take 1 tablet (5 mg total) by mouth daily as needed (bladder spasms). 07/16/21 07/16/22  Despina Arias, MD  tamsulosin (FLOMAX) 0.4 MG CAPS capsule Take 1 capsule (0.4 mg total) by mouth daily after supper. 07/16/21  Despina Arias, MD  Vitamin D, Ergocalciferol, (DRISDOL) 1.25 MG (50000 UNIT) CAPS capsule Take 50,000 Units by mouth every 7 (seven) days. Sunday 05/02/20   [provider]    Family History History reviewed. No pertinent family history.  Social History Social History   Tobacco Use   Smoking status: Never   Smokeless tobacco: Never  Substance Use Topics   Alcohol use: Not Currently   Drug use: Never     Allergies   Patient has no known allergies.   Review of Systems Review of Systems  Constitutional: Negative.   Respiratory:  Negative.    Cardiovascular: Negative.   Gastrointestinal: Negative.   Genitourinary:  Positive for hematuria. Negative for dysuria, flank pain, frequency, pelvic pain and urgency.  Musculoskeletal: Negative.   Skin: Negative.   Psychiatric/Behavioral: Negative.      Physical Exam Triage Vital Signs ED Triage Vitals [08/03/21 1523]  Enc Vitals Group     BP (!) 154/81     Pulse Rate 67     Resp 18     Temp 99.1 F (37.3 C)     Temp Source Oral     SpO2 92 %     Weight      Height      Head Circumference      Peak Flow      Pain Score      Pain Loc      Pain Edu?      Excl. in GC?    No data found.  Updated Vital Signs BP (!) 154/81 (BP Location: Right Arm)   Pulse 67   Temp 99.1 F (37.3 C) (Oral)   Resp 18   SpO2 92%   Visual Acuity Right Eye Distance:   Left Eye Distance:   Bilateral Distance:    Right Eye Near:   Left Eye Near:    Bilateral Near:     Physical Exam Exam conducted with a chaperone present Victorino Dike, Charity fundraiser).  Constitutional:      General: She is not in acute distress.    Appearance: Normal appearance. She is well-developed.  HENT:     Head: Normocephalic and atraumatic.     Mouth/Throat:     Mouth: Mucous membranes are moist.  Eyes:     Conjunctiva/sclera: Conjunctivae normal.     Pupils: Pupils are equal, round, and reactive to light.  Neck:     Thyroid: No thyromegaly.     Trachea: No tracheal deviation.  Cardiovascular:     Rate and Rhythm: Normal rate and regular rhythm.     Pulses: Normal pulses.     Heart sounds: Normal heart sounds.  Pulmonary:     Effort: Pulmonary effort is normal.     Breath sounds: Normal breath sounds.  Abdominal:     General: Bowel sounds are normal. There is no distension.     Palpations: Abdomen is soft.     Tenderness: There is no abdominal tenderness. There is no right CVA tenderness or left CVA tenderness.  Genitourinary:    Urethra: No prolapse, urethral swelling or urethral lesion.      Comments: No bleeding found from urethra or rectum Musculoskeletal:     Cervical back: Normal range of motion and neck supple.  Skin:    General: Skin is warm and dry.     Capillary Refill: Capillary refill takes less than 2 seconds.  Neurological:     General: No focal deficit present.     Mental Status:  She is alert.     Comments: Alert and oriented x2  Psychiatric:        Behavior: Behavior normal.        Thought Content: Thought content normal.        Judgment: Judgment normal.     UC Treatments / Results  Labs (all labs ordered are listed, but only abnormal results are displayed) Labs Reviewed  POCT URINALYSIS DIP (MANUAL ENTRY) - Abnormal; Notable for the following components:      Result Value   Color, UA red (*)    Clarity, UA cloudy (*)    Ketones, POC UA trace (5) (*)    Spec Grav, UA >=1.030 (*)    Blood, UA large (*)    Protein Ur, POC >=300 (*)    Nitrite, UA Positive (*)    Leukocytes, UA Small (1+) (*)    All other components within normal limits  URINE CULTURE  CBC WITH DIFFERENTIAL/PLATELET    EKG   Radiology No results found.  Procedures Procedures (including critical care time)  Medications Ordered in UC Medications - No data to display  Initial Impression / Assessment and Plan / UC Course  I have reviewed the triage vital signs and the nursing notes.  Pertinent labs & imaging results that were available during my care of the patient were reviewed by me and considered in my medical decision making (see chart for details).  The patient is a 88 year old female who presents with her daughter for bleeding found with urination and in her diaper.  Patient's daughter was concerned because the patient had a ureteral stent placed approximately 3 weeks ago.  The patient presents afebrile, her vital signs are stable, and she is in no acute distress.  On her exam, there is no frank bleeding or obvious bleeding noted as she is at her urethra or vagina or her  rectum.  Discussion with daughter, we will collect a CBC to determine if there is other blood loss.  Urinalysis was also done today which showed positive leukocytes and nitrates, consistent with acute cystitis. patient's daughter was advised that she should contact the surgeons office that performed her mother's procedure to let them know of the bleeding.  Patient's daughter was also reassured that patient is stable at this time.  Patient can also follow-up with Dr. Margo AyeHall within the next 24 to 48 hours for further evaluation. Final Clinical Impressions(s) / UC Diagnoses   Final diagnoses:  Hematuria, unspecified type  Acute cystitis with hematuria     Discharge Instructions      There was no bleeding found on the patient's exam today. The urinalysis does show signs of a urinary tract infection as indicated by leukocytes and nitrates.  A urine culture has also been ordered to confirm that the patient is being treated with the appropriate antibiotic. As discussed, recommend following up with the surgeon and Dr. Scharlene GlossHall's office for further evaluation.  Contact Dr. Scharlene GlossHall's office for your mother to be seen within the next 24 to 48 hours. Go to the emergency department immediately if she develops worsening bleeding, fever, chills, abdominal pain, or other concerns.     ED Prescriptions     Medication Sig Dispense Auth. Provider   amoxicillin-clavulanate (AUGMENTIN) 875-125 MG tablet Take 1 tablet by mouth every 12 (twelve) hours. 14 tablet Lamin Chandley-Warren, Sadie Haberhristie J, NP      PDMP not reviewed this encounter.   Abran CantorLeath-Warren, Jahnia Hewes J, NP 08/03/21 1723

## 2021-08-03 NOTE — Discharge Instructions (Addendum)
There was no bleeding found on the patient's exam today. The urinalysis does show signs of a urinary tract infection as indicated by leukocytes and nitrates.  A urine culture has also been ordered to confirm that the patient is being treated with the appropriate antibiotic. As discussed, recommend following up with the surgeon and Dr. Juel Burrow office for further evaluation.  Contact Dr. Juel Burrow office for your mother to be seen within the next 24 to 48 hours. Go to the emergency department immediately if she develops worsening bleeding, fever, chills, abdominal pain, or other concerns.

## 2021-08-04 LAB — CBC WITH DIFFERENTIAL/PLATELET
Basophils Absolute: 0 10*3/uL (ref 0.0–0.2)
Basos: 1 %
EOS (ABSOLUTE): 0.1 10*3/uL (ref 0.0–0.4)
Eos: 1 %
Hematocrit: 37.6 % (ref 34.0–46.6)
Hemoglobin: 13 g/dL (ref 11.1–15.9)
Immature Grans (Abs): 0 10*3/uL (ref 0.0–0.1)
Immature Granulocytes: 0 %
Lymphocytes Absolute: 1.5 10*3/uL (ref 0.7–3.1)
Lymphs: 23 %
MCH: 31.9 pg (ref 26.6–33.0)
MCHC: 34.6 g/dL (ref 31.5–35.7)
MCV: 92 fL (ref 79–97)
Monocytes Absolute: 0.5 10*3/uL (ref 0.1–0.9)
Monocytes: 7 %
Neutrophils Absolute: 4.4 10*3/uL (ref 1.4–7.0)
Neutrophils: 68 %
Platelets: 272 10*3/uL (ref 150–450)
RBC: 4.07 x10E6/uL (ref 3.77–5.28)
RDW: 12.7 % (ref 11.7–15.4)
WBC: 6.5 10*3/uL (ref 3.4–10.8)

## 2021-08-06 LAB — URINE CULTURE: Culture: >=100000 — AB

## 2021-08-09 DIAGNOSIS — Z515 Encounter for palliative care: Secondary | ICD-10-CM | POA: Diagnosis not present

## 2021-08-21 DIAGNOSIS — N3000 Acute cystitis without hematuria: Secondary | ICD-10-CM | POA: Diagnosis not present

## 2021-08-21 DIAGNOSIS — N201 Calculus of ureter: Secondary | ICD-10-CM | POA: Diagnosis not present

## 2021-09-04 DIAGNOSIS — Z515 Encounter for palliative care: Secondary | ICD-10-CM | POA: Diagnosis not present

## 2021-09-05 IMAGING — DX DG CHEST 1V PORT
1 series · 1 of 1 positions shown · non-contrast
Comparison: July 03, 2020

CLINICAL DATA: Cough, congestion and shortness of breath.

EXAM:
PORTABLE CHEST 1 VIEW

[chest ap]
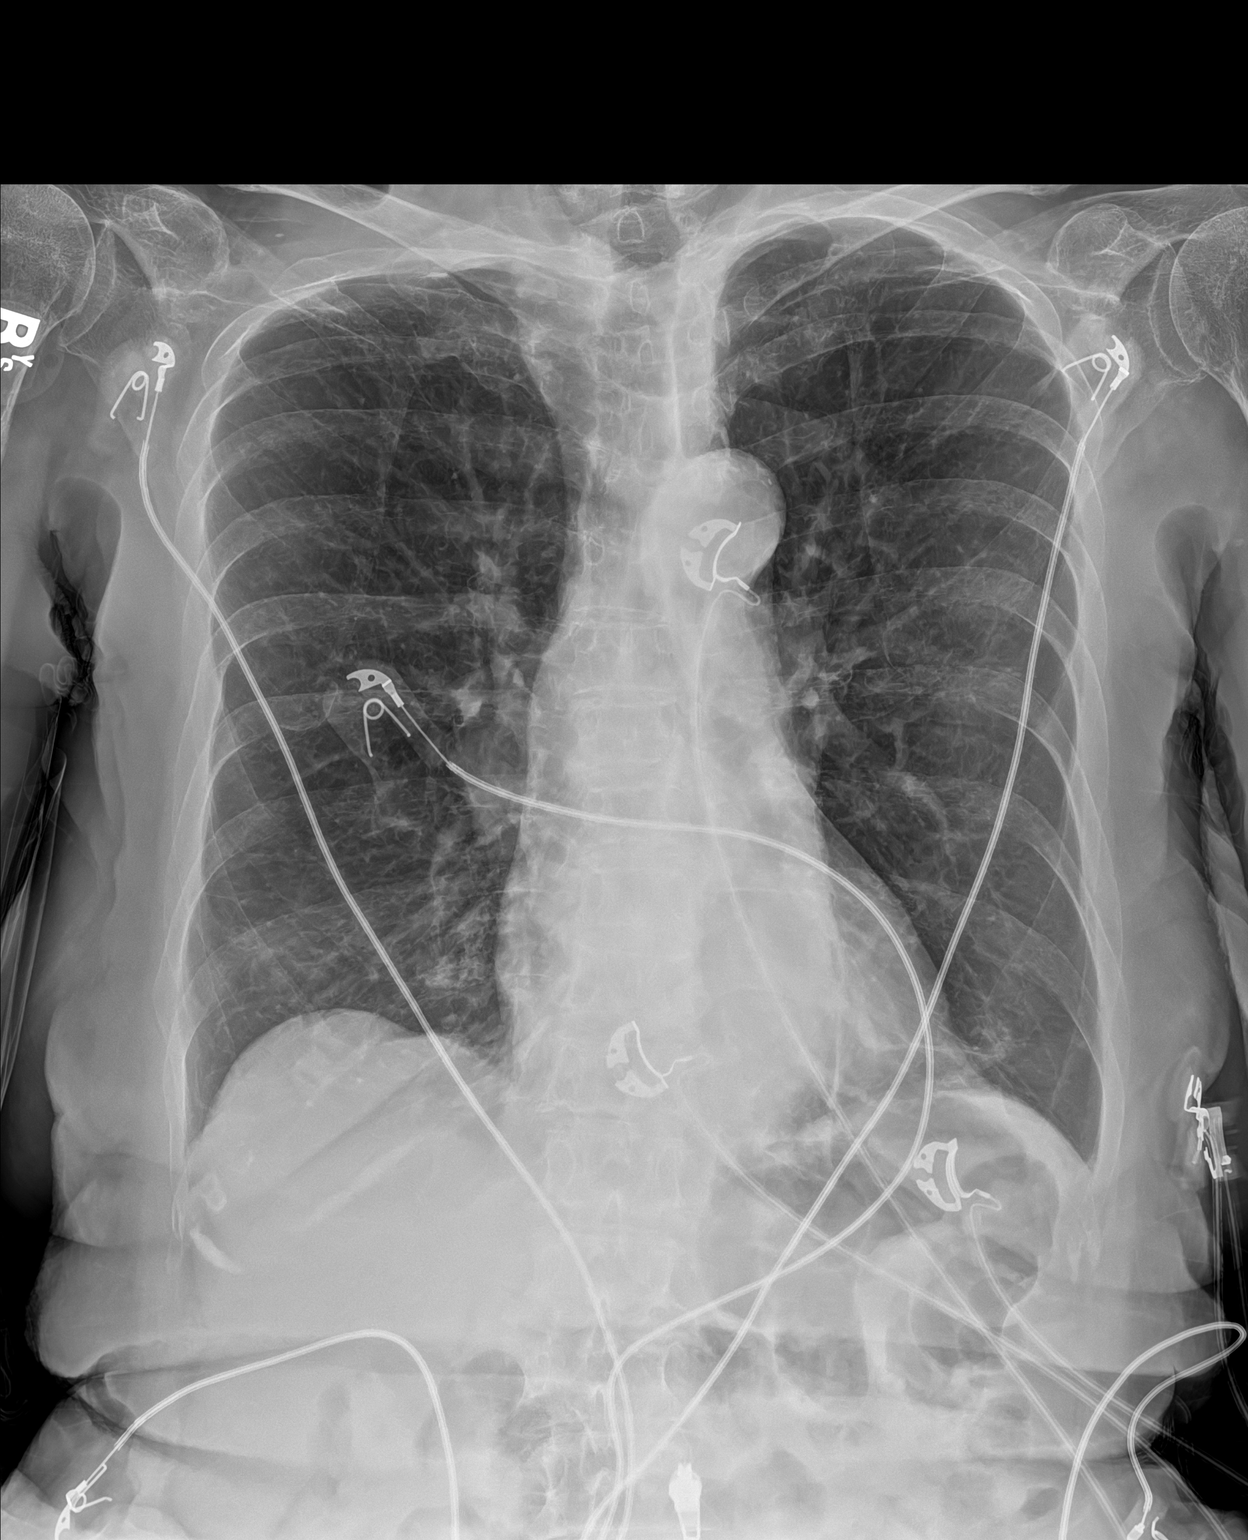

[1 of 1 positions shown; findings below may reference images not displayed]

FINDINGS: The lungs are hyperinflated. Chronic appearing increased lung
markings are seen without evidence of acute infiltrate, pleural
effusion or pneumothorax. The heart size and mediastinal contours
are within normal limits. Degenerative changes seen throughout the
thoracic spine.
IMPRESSION: COPD without acute or active cardiopulmonary disease.

## 2021-10-12 DIAGNOSIS — N390 Urinary tract infection, site not specified: Secondary | ICD-10-CM | POA: Diagnosis not present

## 2021-10-12 DIAGNOSIS — M545 Low back pain, unspecified: Secondary | ICD-10-CM | POA: Diagnosis not present

## 2021-10-12 DIAGNOSIS — R454 Irritability and anger: Secondary | ICD-10-CM | POA: Diagnosis not present

## 2021-10-12 DIAGNOSIS — Z1152 Encounter for screening for COVID-19: Secondary | ICD-10-CM | POA: Diagnosis not present

## 2021-10-16 DIAGNOSIS — Z515 Encounter for palliative care: Secondary | ICD-10-CM | POA: Diagnosis not present

## 2021-11-08 DIAGNOSIS — Z7409 Other reduced mobility: Secondary | ICD-10-CM | POA: Diagnosis not present

## 2021-11-08 DIAGNOSIS — R350 Frequency of micturition: Secondary | ICD-10-CM | POA: Diagnosis not present

## 2021-11-08 DIAGNOSIS — N390 Urinary tract infection, site not specified: Secondary | ICD-10-CM | POA: Diagnosis not present

## 2021-11-08 DIAGNOSIS — I10 Essential (primary) hypertension: Secondary | ICD-10-CM | POA: Diagnosis not present

## 2021-11-08 DIAGNOSIS — E43 Unspecified severe protein-calorie malnutrition: Secondary | ICD-10-CM | POA: Diagnosis not present

## 2021-11-13 DIAGNOSIS — Z515 Encounter for palliative care: Secondary | ICD-10-CM | POA: Diagnosis not present

## 2021-12-13 DIAGNOSIS — R059 Cough, unspecified: Secondary | ICD-10-CM | POA: Diagnosis not present

## 2021-12-13 DIAGNOSIS — R058 Other specified cough: Secondary | ICD-10-CM | POA: Diagnosis not present

## 2022-01-01 DIAGNOSIS — Z515 Encounter for palliative care: Secondary | ICD-10-CM | POA: Diagnosis not present

## 2022-02-05 DIAGNOSIS — Z515 Encounter for palliative care: Secondary | ICD-10-CM | POA: Diagnosis not present

## 2022-02-14 DIAGNOSIS — R0902 Hypoxemia: Secondary | ICD-10-CM | POA: Diagnosis not present

## 2022-02-14 DIAGNOSIS — Z515 Encounter for palliative care: Secondary | ICD-10-CM | POA: Diagnosis not present

## 2022-02-14 DIAGNOSIS — M6281 Muscle weakness (generalized): Secondary | ICD-10-CM | POA: Diagnosis not present

## 2022-02-14 DIAGNOSIS — R2689 Other abnormalities of gait and mobility: Secondary | ICD-10-CM | POA: Diagnosis not present

## 2022-02-14 DIAGNOSIS — J45909 Unspecified asthma, uncomplicated: Secondary | ICD-10-CM | POA: Diagnosis not present

## 2022-02-14 DIAGNOSIS — I10 Essential (primary) hypertension: Secondary | ICD-10-CM | POA: Diagnosis not present

## 2022-02-14 DIAGNOSIS — E559 Vitamin D deficiency, unspecified: Secondary | ICD-10-CM | POA: Diagnosis not present

## 2022-02-14 DIAGNOSIS — R944 Abnormal results of kidney function studies: Secondary | ICD-10-CM | POA: Diagnosis not present

## 2022-02-14 DIAGNOSIS — Z0000 Encounter for general adult medical examination without abnormal findings: Secondary | ICD-10-CM | POA: Diagnosis not present

## 2022-02-27 ENCOUNTER — Emergency Department (HOSPITAL_COMMUNITY): Payer: Medicare Other

## 2022-02-27 ENCOUNTER — Other Ambulatory Visit: Payer: Self-pay

## 2022-02-27 ENCOUNTER — Encounter (HOSPITAL_COMMUNITY): Payer: Self-pay | Admitting: Emergency Medicine

## 2022-02-27 ENCOUNTER — Emergency Department (HOSPITAL_COMMUNITY)
Admission: EM | Admit: 2022-02-27 | Discharge: 2022-03-01 | Disposition: A | Discharge status: Home / Self Care | Payer: Medicare Other | Attending: Emergency Medicine | Admitting: Emergency Medicine

## 2022-02-27 DIAGNOSIS — S0990XA Unspecified injury of head, initial encounter: Secondary | ICD-10-CM | POA: Diagnosis not present

## 2022-02-27 DIAGNOSIS — E860 Dehydration: Secondary | ICD-10-CM | POA: Insufficient documentation

## 2022-02-27 DIAGNOSIS — R296 Repeated falls: Secondary | ICD-10-CM | POA: Insufficient documentation

## 2022-02-27 DIAGNOSIS — I10 Essential (primary) hypertension: Secondary | ICD-10-CM | POA: Diagnosis not present

## 2022-02-27 DIAGNOSIS — W19XXXA Unspecified fall, initial encounter: Secondary | ICD-10-CM

## 2022-02-27 DIAGNOSIS — M47812 Spondylosis without myelopathy or radiculopathy, cervical region: Secondary | ICD-10-CM | POA: Diagnosis not present

## 2022-02-27 DIAGNOSIS — R531 Weakness: Secondary | ICD-10-CM | POA: Diagnosis not present

## 2022-02-27 DIAGNOSIS — R410 Disorientation, unspecified: Secondary | ICD-10-CM | POA: Diagnosis not present

## 2022-02-27 DIAGNOSIS — F0150 Vascular dementia without behavioral disturbance: Secondary | ICD-10-CM | POA: Insufficient documentation

## 2022-02-27 DIAGNOSIS — Z79899 Other long term (current) drug therapy: Secondary | ICD-10-CM | POA: Diagnosis not present

## 2022-02-27 DIAGNOSIS — R6889 Other general symptoms and signs: Secondary | ICD-10-CM | POA: Diagnosis not present

## 2022-02-27 DIAGNOSIS — Z743 Need for continuous supervision: Secondary | ICD-10-CM | POA: Diagnosis not present

## 2022-02-27 DIAGNOSIS — G9389 Other specified disorders of brain: Secondary | ICD-10-CM | POA: Diagnosis not present

## 2022-02-27 DIAGNOSIS — I739 Peripheral vascular disease, unspecified: Secondary | ICD-10-CM | POA: Diagnosis not present

## 2022-02-27 DIAGNOSIS — G238 Other specified degenerative diseases of basal ganglia: Secondary | ICD-10-CM | POA: Diagnosis not present

## 2022-02-27 LAB — URINALYSIS, ROUTINE W REFLEX MICROSCOPIC
Bilirubin Urine: NEGATIVE
Glucose, UA: NEGATIVE mg/dL
Hgb urine dipstick: NEGATIVE
Ketones, ur: 20 mg/dL — AB
Leukocytes,Ua: NEGATIVE
Nitrite: NEGATIVE
Protein, ur: NEGATIVE mg/dL
Specific Gravity, Urine: 1.013 (ref 1.005–1.030)
pH: 5 (ref 5.0–8.0)

## 2022-02-27 LAB — COMPREHENSIVE METABOLIC PANEL
ALT: 14 U/L (ref 0–44)
AST: 21 U/L (ref 15–41)
Albumin: 3.6 g/dL (ref 3.5–5.0)
Alkaline Phosphatase: 60 U/L (ref 38–126)
Anion gap: 12 (ref 5–15)
BUN: 26 mg/dL — ABNORMAL HIGH (ref 8–23)
CO2: 21 mmol/L — ABNORMAL LOW (ref 22–32)
Calcium: 9.4 mg/dL (ref 8.9–10.3)
Chloride: 105 mmol/L (ref 98–111)
Creatinine, Ser: 1.36 mg/dL — ABNORMAL HIGH (ref 0.44–1.00)
GFR, Estimated: 36 mL/min — ABNORMAL LOW (ref >60)
Glucose, Bld: 92 mg/dL (ref 70–99)
Potassium: 4.6 mmol/L (ref 3.5–5.1)
Sodium: 138 mmol/L (ref 135–145)
Total Bilirubin: 0.8 mg/dL (ref 0.3–1.2)
Total Protein: 7.3 g/dL (ref 6.5–8.1)

## 2022-02-27 LAB — CBC WITH DIFFERENTIAL/PLATELET
Abs Immature Granulocytes: 0.03 10*3/uL (ref 0.00–0.07)
Basophils Absolute: 0 10*3/uL (ref 0.0–0.1)
Basophils Relative: 1 %
Eosinophils Absolute: 0.1 10*3/uL (ref 0.0–0.5)
Eosinophils Relative: 1 %
HCT: 35.7 % — ABNORMAL LOW (ref 36.0–46.0)
Hemoglobin: 11.5 g/dL — ABNORMAL LOW (ref 12.0–15.0)
Immature Granulocytes: 1 %
Lymphocytes Relative: 12 %
Lymphs Abs: 0.6 10*3/uL — ABNORMAL LOW (ref 0.7–4.0)
MCH: 30.6 pg (ref 26.0–34.0)
MCHC: 32.2 g/dL (ref 30.0–36.0)
MCV: 94.9 fL (ref 80.0–100.0)
Monocytes Absolute: 0.8 10*3/uL (ref 0.1–1.0)
Monocytes Relative: 14 %
Neutro Abs: 3.7 10*3/uL (ref 1.7–7.7)
Neutrophils Relative %: 71 %
Platelets: 215 10*3/uL (ref 150–400)
RBC: 3.76 MIL/uL — ABNORMAL LOW (ref 3.87–5.11)
RDW: 14.4 % (ref 11.5–15.5)
WBC: 5.2 10*3/uL (ref 4.0–10.5)
nRBC: 0 % (ref 0.0–0.2)

## 2022-02-27 MED ORDER — IPRATROPIUM-ALBUTEROL 0.5-2.5 (3) MG/3ML IN SOLN: 3.0000 mL | Freq: Every day | RESPIRATORY_TRACT | Status: DC | PRN

## 2022-02-27 MED ORDER — LOSARTAN POTASSIUM 25 MG PO TABS
50.0000 mg | ORAL_TABLET | Freq: Every evening | ORAL | Status: DC
  Administered 2022-02-27 – 2022-02-28 (×2): 50 mg via ORAL
  Filled 2022-02-27 (×2): qty 2

## 2022-02-27 MED ORDER — SODIUM CHLORIDE 0.9 % IV BOLUS
500.0000 mL | Freq: Once | INTRAVENOUS | Status: AC
  Administered 2022-02-27: 500 mL via INTRAVENOUS

## 2022-02-27 NOTE — ED Notes (Signed)
Compression socks removed per pt request.

## 2022-02-27 NOTE — ED Provider Notes (Signed)
[facility] EMERGENCY DEPARTMENT Provider Note   CSN: [id number] Arrival date & time: 02/27/22  6301     History {Add pertinent medical, surgical, social history, OB history to HPI:1} Chief Complaint  Patient presents with   Fall   Weakness    Ruth Pierce is a 88 y.o. female.  HPI Patient presents after a series of falls over the past few days.  She notes that now, with attempts at ambulation, or standing upright she has shakiness in her legs, weakness.  This is new, though is unclear if this occurred before or after her initial fall which was a few days ago.  Fall resulted in head trauma, with a hematoma posterior that has essentially improved.  However, today the patient had another fall, unclear etiology.  No loss of consciousness, no head trauma during this event.  She denies new focal pain, new focal weakness, but notes change globally over the past few days.    Home Medications Prior to Admission medications   Medication Sig Start Date End Date Taking? Authorizing Provider  albuterol (VENTOLIN HFA) 108 (90 Base) MCG/ACT inhaler Inhale 1-2 puffs into the lungs every 4 (four) hours as needed for wheezing or shortness of breath. 07/18/20   Long, Arlyss Repress, MD  amoxicillin-clavulanate (AUGMENTIN) 875-125 MG tablet Take 1 tablet by mouth every 12 (twelve) hours. 08/03/21   Leath-Warren, Sadie Haber, NP  aspirin 81 MG EC tablet Take 81 mg by mouth daily as needed for pain. Swallow whole.    [provider]  celecoxib (CELEBREX) 100 MG capsule Take 1 capsule (100 mg total) by mouth 2 (two) times daily as needed for moderate pain. 07/16/21 07/16/22  Despina Arias, MD  cephALEXin (KEFLEX) 500 MG capsule Take 1 capsule (500 mg total) by mouth 2 (two) times daily. 07/16/21   Despina Arias, MD  ipratropium-albuterol (DUONEB) 0.5-2.5 (3) MG/3ML SOLN Inhale 3 mLs into the lungs daily as needed (for shorntess of breath). 06/19/21   [provider]  losartan (COZAAR) 50 MG  tablet Take 50 mg by mouth every evening. 05/02/20   [provider]  solifenacin (VESICARE) 5 MG tablet Take 1 tablet (5 mg total) by mouth daily as needed (bladder spasms). 07/16/21 07/16/22  Despina Arias, MD  tamsulosin (FLOMAX) 0.4 MG CAPS capsule Take 1 capsule (0.4 mg total) by mouth daily after supper. 07/16/21   Despina Arias, MD  Vitamin D, Ergocalciferol, (DRISDOL) 1.25 MG (50000 UNIT) CAPS capsule Take 50,000 Units by mouth every 7 (seven) days. Sunday 05/02/20   [provider]      Allergies    Patient has no known allergies.    Review of Systems   Review of Systems  All other systems reviewed and are negative.   Physical Exam Updated Vital Signs BP 113/80 (BP Location: Left Arm)   Pulse 79   Temp (!) 97.4 F (36.3 C) (Oral)   Resp 15   Ht 5\' 2"  (1.575 m)   Wt 47.2 kg   SpO2 93%   BMI 19.03 kg/m  Physical Exam Vitals and nursing note reviewed.  Constitutional:      General: She is not in acute distress.    Appearance: She is well-developed.  HENT:     Head: Normocephalic and atraumatic.  Eyes:     Conjunctiva/sclera: Conjunctivae normal.  Pulmonary:     Effort: Pulmonary effort is normal. No respiratory distress.     Breath sounds: No stridor.  Abdominal:  General: There is no distension.  Musculoskeletal:     Cervical back: No rigidity. No pain with movement, spinous process tenderness or muscular tenderness.  Skin:    General: Skin is warm and dry.  Neurological:     Mental Status: She is alert and oriented to person, place, and time.     Cranial Nerves: No cranial nerve deficit.     Comments: Substantial diffuse atrophy, no focal weakness.  Face is symmetric, speech is clear.  Psychiatric:        Mood and Affect: Mood normal.     ED Results / Procedures / Treatments   Labs (all labs ordered are listed, but only abnormal results are displayed) Labs Reviewed  COMPREHENSIVE METABOLIC PANEL  CBC WITH DIFFERENTIAL/PLATELET   URINALYSIS, ROUTINE W REFLEX MICROSCOPIC    EKG None  Radiology No results found.  Procedures Procedures  {Document cardiac monitor, telemetry assessment procedure when appropriate:1}  Medications Ordered in ED Medications  sodium chloride 0.9 % bolus 500 mL (has no administration in time range)    ED Course/ Medical Decision Making/ A&P   This patient with a Hx of vascular dementia, hypertension presents to the ED for concern of weakness, new falling frequency, prior head trauma, this involves an extensive number of treatment options, and is a complaint that carries with it a high risk of complications and morbidity.    The differential diagnosis includes intracranial hemorrhage, fracture, electrolyte abnormalities, dehydration, infection   Social Determinants of Health:  Advanced age, dementia  Additional history obtained:  Additional history and/or information obtained from chart review, notable for multiple visits over the past year for palliative care   After the initial evaluation, orders, including: CT labs x-ray were initiated.   Patient placed on Cardiac and Pulse-Oximetry Monitors. The patient was maintained on a cardiac monitor.  The cardiac monitored showed an rhythm of 80 sinus normal The patient was also maintained on pulse oximetry. The readings were typically 100% room air normal   On repeat evaluation of the patient {resolved/improved/worsened:23923::"improved"}  Lab Tests:  I personally interpreted labs.  The pertinent results include:  ***  Imaging Studies ordered:  I independently visualized and interpreted imaging which showed *** I agree with the radiologist interpretation  Consultations Obtained:  I requested consultation with the ***,  and discussed lab and imaging findings as well as pertinent plan - they recommend: ***  Dispostion / Final MDM:  After consideration of the diagnostic results and the patient's response to treatment,  ***   Final Clinical Impression(s) / ED Diagnoses Final diagnoses:  None    Rx / DC Orders ED Discharge Orders     None

## 2022-02-27 NOTE — ED Notes (Signed)
Placed on hospital bed for for comfort and repositioning.

## 2022-02-27 NOTE — ED Triage Notes (Signed)
Pt BIB [facility] from home after pt had a fall this morning. Pt has no complaints of pain. Per EMS family wants pt placed in SNF.

## 2022-02-28 DIAGNOSIS — R531 Weakness: Secondary | ICD-10-CM | POA: Diagnosis not present

## 2022-02-28 NOTE — ED Notes (Signed)
Per pt daughter pt has been accepted at facility in [place]. Daughter is leaving at this time to go see facility and when pt is ready daughter will transport pt to facility. Pt sitting in recliner at bedside. Call bell in reach. Warm blanket provided. No other needs requested at this time.

## 2022-02-28 NOTE — ED Notes (Signed)
CSW updated pts daughter that [facility] is unable to offer pt a bed. Pts daughter requested CSW send referral to [facility] and [facility]. CSW spoke to [place] with [facility] who states their facility is full and unable to offer a bed. CSW updated pts daughter of this and updated that [facility] was able to offer pt a bed. Pts daughter accepts this bed. TOC to start insurance auth. TOC to follow.

## 2022-02-28 NOTE — [place] FL2 (Addendum)
[place] MEDICAID FL2 LEVEL OF CARE FORM     IDENTIFICATION  Patient Name: Ruth Pierce Birthdate: 1927/03/10 Sex: female Admission Date (Current Location): 02/27/2022  [facility] and [place] Number:  [employer] and Address:  [facility],  [address] [place] [address]      Provider Number: [id number]  Attending Physician Name and Address:  Default, Provider, MD  Relative Name and Phone Number:       Current Level of Care: Hospital Recommended Level of Care: Skilled Nursing Facility Prior Approval Number:    Date Approved/Denied:   PASRR Number:  [id number]  Discharge Plan: SNF    Current Diagnoses: Patient Active Problem List   Diagnosis Date Noted   Vascular dementia (HCC) 07/14/2020   HTN (hypertension) 07/14/2020   Vitamin D deficiency 07/14/2020   Giant cell arteritis (HCC) 07/14/2020   Headache 07/14/2020    Orientation RESPIRATION BLADDER Height & Weight     Self  Normal Continent Weight: 104 lb 0.9 oz (47.2 kg) Height:  5\' 2"  (157.5 cm)  BEHAVIORAL SYMPTOMS/MOOD NEUROLOGICAL BOWEL NUTRITION STATUS      Continent Diet (DYS 3)  AMBULATORY STATUS COMMUNICATION OF NEEDS Skin   Extensive Assist Verbally Normal                       Personal Care Assistance Level of Assistance  Bathing, Feeding, Dressing Bathing Assistance: Limited assistance Feeding assistance: Independent Dressing Assistance: Limited assistance     Functional Limitations Info  Sight, Hearing, Speech Sight Info: Adequate Hearing Info: Impaired Speech Info: Adequate    SPECIAL CARE FACTORS FREQUENCY  PT (By licensed PT), OT (By licensed OT)     PT Frequency: 5 times weekly OT Frequency: 5 times weekly            Contractures Contractures Info: Not present    Additional Factors Info  Code Status, Allergies Code Status Info: FULL Allergies Info: NKA           Current Medications (02/28/2022):  This is the current hospital active  medication list Current Facility-Administered Medications  Medication Dose Route Frequency Provider Last Rate Last Admin   ipratropium-albuterol (DUONEB) 0.5-2.5 (3) MG/3ML nebulizer solution 3 mL  3 mL Inhalation Daily PRN 03/02/2022, MD       losartan (COZAAR) tablet 50 mg  50 mg Oral QPM Gerhard Munch, MD   50 mg at 02/27/22 1836   Current Outpatient Medications  Medication Sig Dispense Refill   albuterol (VENTOLIN HFA) 108 (90 Base) MCG/ACT inhaler Inhale 1-2 puffs into the lungs every 4 (four) hours as needed for wheezing or shortness of breath. 6.7 g 0   aspirin 81 MG EC tablet Take 81 mg by mouth daily as needed for pain. Swallow whole.     ipratropium-albuterol (DUONEB) 0.5-2.5 (3) MG/3ML SOLN Inhale 3 mLs into the lungs daily as needed (for shorntess of breath).     losartan (COZAAR) 50 MG tablet Take 50 mg by mouth every evening.     sulfamethoxazole-trimethoprim (BACTRIM DS) 800-160 MG tablet Take 1 tablet by mouth 2 (two) times daily.     Vitamin D, Ergocalciferol, (DRISDOL) 1.25 MG (50000 UNIT) CAPS capsule Take 50,000 Units by mouth every 7 (seven) days. Sunday     amoxicillin-clavulanate (AUGMENTIN) 875-125 MG tablet Take 1 tablet by mouth every 12 (twelve) hours. (Patient not taking: Reported on 02/27/2022) 14 tablet 0   celecoxib (CELEBREX) 100 MG capsule Take 1 capsule (100  mg total) by mouth 2 (two) times daily as needed for moderate pain. (Patient not taking: Reported on 02/27/2022) 30 capsule 1   cephALEXin (KEFLEX) 500 MG capsule Take 1 capsule (500 mg total) by mouth 2 (two) times daily. (Patient not taking: Reported on 02/27/2022) 10 capsule 0   solifenacin (VESICARE) 5 MG tablet Take 1 tablet (5 mg total) by mouth daily as needed (bladder spasms). (Patient not taking: Reported on 02/27/2022) 30 tablet 1   tamsulosin (FLOMAX) 0.4 MG CAPS capsule Take 1 capsule (0.4 mg total) by mouth daily after supper. (Patient not taking: Reported on 02/27/2022) 30 capsule 0      Discharge Medications: Please see discharge summary for a list of discharge medications.  Relevant Imaging Results:  Relevant Lab Results:   Additional Information SSN: [id number] [place]

## 2022-02-28 NOTE — ED Notes (Signed)
Pt moved from recliner to bed. Brief changed . Bed lowered and locked with call bell in reach. Family at bedside.

## 2022-02-28 NOTE — Plan of Care (Signed)
  Problem: Acute Rehab PT Goals(only PT should resolve) Goal: Pt Will Go Supine/Side To Sit Outcome: Progressing Flowsheets (Taken 02/28/2022 1217) Pt will go Supine/Side to Sit:  with min guard assist  with supervision Goal: Patient Will Transfer Sit To/From Stand Outcome: Progressing Flowsheets (Taken 02/28/2022 1217) Patient will transfer sit to/from stand:  with minimal assist  with min guard assist Goal: Pt Will Transfer Bed To Chair/Chair To Bed Outcome: Progressing Flowsheets (Taken 02/28/2022 1217) Pt will Transfer Bed to Chair/Chair to Bed: with min assist Goal: Pt Will Ambulate Outcome: Progressing Flowsheets (Taken 02/28/2022 1217) Pt will Ambulate:  25 feet  with minimal assist  with rolling walker   12:17 PM, 02/28/22 Ocie Bob, MPT Physical Therapist with [facility] [phone] office [phone] mobile phone

## 2022-02-28 NOTE — ED Notes (Signed)
Transition of Care [facility]) - Emergency Department Mini Assessment   Patient Details  Name: Ruth Pierce MRN: [id number] Date of Birth: 18-Nov-1926  Transition of Care [facility]) CM/SW Contact:    Villa Herb, LCSWA Phone Number: 02/28/2022, 10:42 AM   Clinical Narrative: CSW spoke with pts daughter about PT recommendation for SNF. Pts daughter states she is agreeable to SNF placement and her preference is [facility]. CSW completed Fl2 and sent referral to [facility]. TOC to follow.   ED Mini Assessment: What brought you to the Emergency Department? : Weakness  Barriers to Discharge: ED SNF auth  Barrier interventions: SNF placement     Interventions which prevented an admission or readmission: SNF Placement    Patient Contact and Communications        ,            CMS Medicare.gov Compare Post Acute Care list provided to:: Patient Represenative (must comment) Choice offered to / list presented to : Adult Children  Admission diagnosis:  fall, placement Patient Active Problem List   Diagnosis Date Noted   Vascular dementia (HCC) 07/14/2020   HTN (hypertension) 07/14/2020   Vitamin D deficiency 07/14/2020   Giant cell arteritis (HCC) 07/14/2020   Headache 07/14/2020   PCP:  Benita Stabile, MD Pharmacy:   [facility] [place] [place] - [address] [place] [address] Phone: [phone] Fax: [phone]

## 2022-02-28 NOTE — Evaluation (Signed)
Physical Therapy Evaluation Patient Details Name: Ruth Pierce MRN: [id number] DOB: 20-Jan-1927 Today's Date: 02/28/2022  History of Present Illness  Ruth Pierce is a 88 y.o. female.     HPI  Patient presents after a series of falls over the past few days.  She notes that now, with attempts at ambulation, or standing upright she has shakiness in her legs, weakness.  This is new, though is unclear if this occurred before or after her initial fall which was a few days ago.  Fall resulted in head trauma, with a hematoma posterior that has essentially improved.  However, today the patient had another fall, unclear etiology.  No loss of consciousness, no head trauma during this event.  She denies new focal pain, new focal weakness, but notes change globally over the past few days.   Clinical Impression  Patient demonstrates slow labored movement for sitting up at bedside, very unsteady on feet with buckling of knees and limited to a few steps at bedside before having to sit due to generalized weakness/fatigue.  Patient tolerated sitting up in chair after therapy with daughter present in room - nurse. Patient will benefit from continued skilled physical therapy in hospital and recommended venue below to increase strength, balance, endurance for safe ADLs and gait.          Recommendations for follow up therapy are one component of a multi-disciplinary discharge planning process, led by the attending physician.  Recommendations may be updated based on patient status, additional functional criteria and insurance authorization.  Follow Up Recommendations Skilled nursing-short term rehab (<3 hours/day) Can patient physically be transported by private vehicle: Yes    Assistance Recommended at Discharge Intermittent Supervision/Assistance  Patient can return home with the following  A lot of help with walking and/or transfers;A lot of help with bathing/dressing/bathroom;Assistance with  cooking/housework;Help with stairs or ramp for entrance    Equipment Recommendations None recommended by PT  Recommendations for Other Services       Functional Status Assessment Patient has had a recent decline in their functional status and demonstrates the ability to make significant improvements in function in a reasonable and predictable amount of time.     Precautions / Restrictions Precautions Precautions: Fall Restrictions Weight Bearing Restrictions: No      Mobility  Bed Mobility Overal bed mobility: Needs Assistance Bed Mobility: Supine to Sit     Supine to sit: Min assist     General bed mobility comments: slightly labored movement with increased time    Transfers Overall transfer level: Needs assistance Equipment used: Rolling walker (2 wheels) Transfers: Sit to/from Stand, Bed to chair/wheelchair/BSC Sit to Stand: Min assist, Mod assist   Step pivot transfers: Min assist, Mod assist       General transfer comment: unsteady labored movement    Ambulation/Gait Ambulation/Gait assistance: Mod assist Gait Distance (Feet): 5 Feet Assistive device: Rolling walker (2 wheels) Gait Pattern/deviations: Decreased step length - right, Decreased step length - left, Decreased stride length, Knees buckling Gait velocity: slow     General Gait Details: limited to a few steps forward/backward at bedside due to poor standing balance, buckling of knees and fatigue  Stairs            Wheelchair Mobility    Modified Rankin (Stroke Patients Only)       Balance Overall balance assessment: Needs assistance Sitting-balance support: Feet supported, No upper extremity supported Sitting balance-Leahy Scale: Fair Sitting balance - Comments: fair/good seated at EOB  Standing balance support: Reliant on assistive device for balance, During functional activity, Bilateral upper extremity supported Standing balance-Leahy Scale: Poor Standing balance comment:  fair/poor using RW                             Pertinent Vitals/Pain Pain Assessment Pain Assessment: No/denies pain    Home Living Family/patient expects to be discharged to:: Private residence Living Arrangements: Children Available Help at Discharge: Family;Available PRN/intermittently Type of Home: House Home Access: Ramped entrance       Home Layout: One level Home Equipment: Agricultural consultant (2 wheels);Rollator (4 wheels)      Prior Function Prior Level of Function : Needs assist       Physical Assist : Mobility (physical);ADLs (physical) Mobility (physical): Bed mobility;Transfers;Gait;Stairs   Mobility Comments: household ambulator using RW ADLs Comments: assisted by family     Hand Dominance        Extremity/Trunk Assessment   Upper Extremity Assessment Upper Extremity Assessment: Generalized weakness    Lower Extremity Assessment Lower Extremity Assessment: Generalized weakness    Cervical / Trunk Assessment Cervical / Trunk Assessment: Kyphotic  Communication   Communication: No difficulties  Cognition Arousal/Alertness: Awake/alert Behavior During Therapy: WFL for tasks assessed/performed Overall Cognitive Status: Within Functional Limits for tasks assessed                                          General Comments      Exercises     Assessment/Plan    PT Assessment Patient needs continued PT services  PT Problem List Decreased strength;Decreased activity tolerance;Decreased balance;Decreased mobility       PT Treatment Interventions DME instruction;Gait training;Stair training;Functional mobility training;Therapeutic activities;Therapeutic exercise;Balance training;Patient/family education    PT Goals (Current goals can be found in the Care Plan section)  Acute Rehab PT Goals Patient Stated Goal: return home after rehab PT Goal Formulation: With patient/family Time For Goal Achievement:  03/14/22 Potential to Achieve Goals: Good    Frequency Min 2X/week     Co-evaluation               AM-PAC PT "6 Clicks" Mobility  Outcome Measure Help needed turning from your back to your side while in a flat bed without using bedrails?: A Little Help needed moving from lying on your back to sitting on the side of a flat bed without using bedrails?: A Little Help needed moving to and from a bed to a chair (including a wheelchair)?: A Lot Help needed standing up from a chair using your arms (e.g., wheelchair or bedside chair)?: A Lot Help needed to walk in hospital room?: A Lot Help needed climbing 3-5 steps with a railing? : A Lot 6 Click Score: 14    End of Session   Activity Tolerance: Patient tolerated treatment well;Patient limited by fatigue Patient left: in chair;with call bell/phone within reach Nurse Communication: Mobility status PT Visit Diagnosis: Unsteadiness on feet (R26.81);Other abnormalities of gait and mobility (R26.89);Muscle weakness (generalized) (M62.81);History of falling (Z91.81)    Time: 3295-1884 PT Time Calculation (min) (ACUTE ONLY): 29 min   Charges:   PT Evaluation $PT Eval Moderate Complexity: 1 Mod PT Treatments $Therapeutic Activity: 23-37 mins        12:13 PM, 02/28/22 Ocie Bob, MPT Physical Therapist with [facility] [phone] office [phone]  mobile phone

## 2022-03-01 DIAGNOSIS — R2681 Unsteadiness on feet: Secondary | ICD-10-CM | POA: Diagnosis not present

## 2022-03-01 DIAGNOSIS — J449 Chronic obstructive pulmonary disease, unspecified: Secondary | ICD-10-CM | POA: Diagnosis not present

## 2022-03-01 DIAGNOSIS — I10 Essential (primary) hypertension: Secondary | ICD-10-CM | POA: Diagnosis not present

## 2022-03-01 DIAGNOSIS — F01A0 Vascular dementia, mild, without behavioral disturbance, psychotic disturbance, mood disturbance, and anxiety: Secondary | ICD-10-CM | POA: Diagnosis not present

## 2022-03-01 DIAGNOSIS — J811 Chronic pulmonary edema: Secondary | ICD-10-CM | POA: Diagnosis not present

## 2022-03-01 DIAGNOSIS — R627 Adult failure to thrive: Secondary | ICD-10-CM | POA: Diagnosis not present

## 2022-03-01 DIAGNOSIS — Z8673 Personal history of transient ischemic attack (TIA), and cerebral infarction without residual deficits: Secondary | ICD-10-CM | POA: Diagnosis not present

## 2022-03-01 DIAGNOSIS — R296 Repeated falls: Secondary | ICD-10-CM | POA: Diagnosis not present

## 2022-03-01 DIAGNOSIS — R531 Weakness: Secondary | ICD-10-CM | POA: Diagnosis not present

## 2022-03-01 DIAGNOSIS — Z515 Encounter for palliative care: Secondary | ICD-10-CM | POA: Diagnosis not present

## 2022-03-01 DIAGNOSIS — R059 Cough, unspecified: Secondary | ICD-10-CM | POA: Diagnosis not present

## 2022-03-01 DIAGNOSIS — M6281 Muscle weakness (generalized): Secondary | ICD-10-CM | POA: Diagnosis not present

## 2022-03-01 DIAGNOSIS — R2689 Other abnormalities of gait and mobility: Secondary | ICD-10-CM | POA: Diagnosis not present

## 2022-03-01 NOTE — ED Provider Notes (Signed)
Emergency Medicine Observation Re-evaluation Note  Ruth Pierce is a 88 y.o. female, seen on rounds today.  Pt initially presented to the ED for complaints of Fall and Weakness Currently, the patient is resting comfortably.  Physical Exam  BP 124/63 (BP Location: Right Arm)   Pulse 76   Temp 97.9 F (36.6 C) (Oral)   Resp 18   Ht 5\' 2"  (1.575 m)   Wt 47.2 kg   SpO2 94%   BMI 19.03 kg/m  Physical Exam Vitals and nursing note reviewed.  Constitutional:      General: She is not in acute distress.    Appearance: She is well-developed.  HENT:     Head: Normocephalic and atraumatic.  Eyes:     Conjunctiva/sclera: Conjunctivae normal.  Cardiovascular:     Rate and Rhythm: Normal rate and regular rhythm.     Heart sounds: No murmur heard. Pulmonary:     Effort: Pulmonary effort is normal. No respiratory distress.  Musculoskeletal:        General: No swelling.     Cervical back: Neck supple.  Skin:    General: Skin is warm and dry.     Capillary Refill: Capillary refill takes less than 2 seconds.  Neurological:     Mental Status: She is alert.  Psychiatric:        Mood and Affect: Mood normal.     ED Course / MDM  EKG:   I have reviewed the labs performed to date as well as medications administered while in observation.  Recent changes in the last 24 hours include social work evaluation and plan for discharge to [facility].  Plan  Current plan is for discharge.    [facility], MD 03/01/22 1204

## 2022-03-01 NOTE — ED Notes (Signed)
Lunch tray given. 

## 2022-03-01 NOTE — Progress Notes (Addendum)
Pt and family accepted bed offer at [facility]. Berkley Harvey is still pending. Daughter to transport to SNF at d/c.   Addend @ 11:52 AM Pt has been approved. Auth ID: [id number]. Approved today and will be reviewed again on 12/18. This CSW has reached out to [facility] to inquire about room assignment and report #.   Addend @ 11:59 AM This CSW attempted to contact the pt's daughter, [name], to inform that the pt has been accepted. Left HIPAA compliant voicemail. Daughter to transport.   Addend @ 12:10 PM Spoke with Destiny @ [facility].Marland Kitchen auth ID given. Report and room # given to RN via secure chat. Will try daughter.   Addend @ 12:17  Daughter made aware that pt is ready. Requested pt receives food before discharging. Alerted EDP and RN of this request via secure chat.

## 2022-03-14 DIAGNOSIS — Z515 Encounter for palliative care: Secondary | ICD-10-CM | POA: Diagnosis not present

## 2022-03-20 DIAGNOSIS — J811 Chronic pulmonary edema: Secondary | ICD-10-CM | POA: Diagnosis not present

## 2022-03-30 DIAGNOSIS — Z515 Encounter for palliative care: Secondary | ICD-10-CM | POA: Diagnosis not present

## 2022-04-03 ENCOUNTER — Other Ambulatory Visit: Payer: Self-pay | Admitting: *Deleted

## 2022-04-03 NOTE — Patient Outreach (Addendum)
Per [facility] Ms. South resides in [facility] skilled nursing facility. Screening for potential [facility] care coordination services as benefit of insurance plan and Primary Care Provider.   Update received from [facility] skilled nursing facility social worker. Transition plan is long term care. Will likely transition to LTC soon.   No identifiable [facility] care coordination needs.   Marthenia Rolling, MSN, RN,BSN [place] Acute Care Coordinator [phone] (Direct dial)

## 2022-04-19 ENCOUNTER — Other Ambulatory Visit: Payer: Self-pay | Admitting: *Deleted

## 2022-04-19 NOTE — Patient Outreach (Signed)
[place] Coordinator follow up.   Update from Kyle, [facility] Education officer, museum. Ruth Pierce is with hospice services provided by [facility] under long term care.   No identifiable [facility] care coordination needs.   Ruth Rolling, MSN, RN,BSN [place] Acute Care Coordinator [phone] (Direct dial)

## 2022-09-02 IMAGING — CT CT ABD-PELV W/ CM
2 of 5 series · 13 of 46 positions shown, 15 images · IV contrast (Omnipaque or Isovue)
Comparison: None.

CLINICAL DATA: Pain right lower quadrant

EXAM:
CT ABDOMEN AND PELVIS WITH CONTRAST
TECHNIQUE: Multidetector CT imaging of the abdomen and pelvis was performed
using the standard protocol following bolus administration of
intravenous contrast.

[Series 2: axial st · axial · 0.73mm/px · z∈[+762,+1122]mm · 10 of 84 slices shown, 12 images]
[im 6/84  soft-tissue]
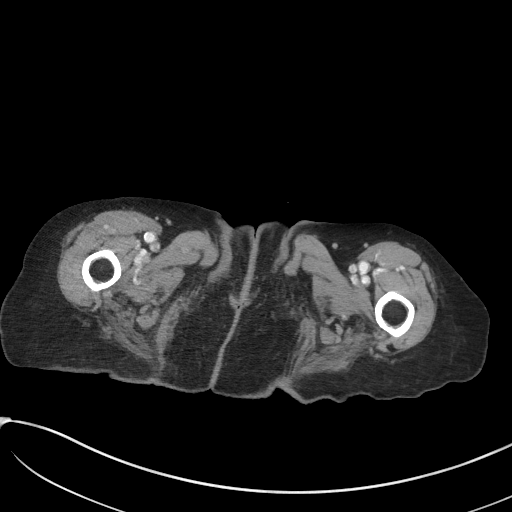
[im 6/84  bone]
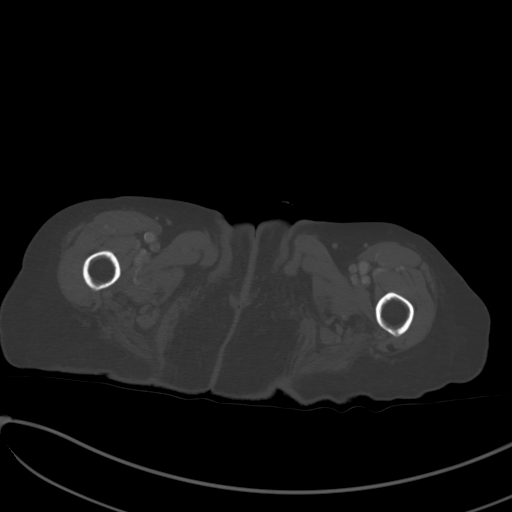
[im 16/84  soft-tissue]
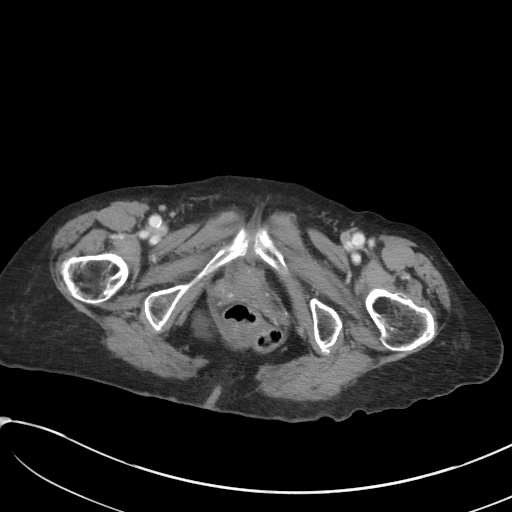
[im 21/84  soft-tissue]
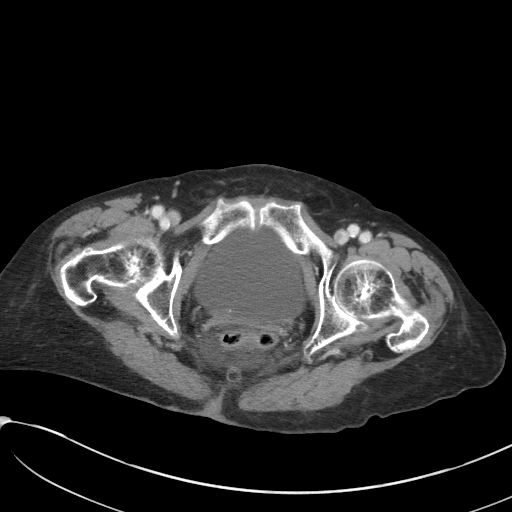
[im 32/84  soft-tissue]
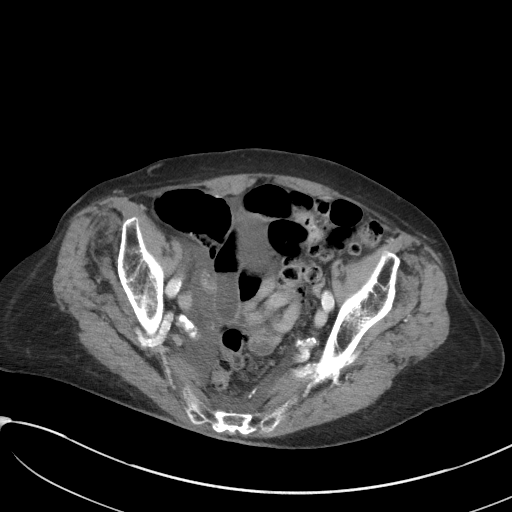
[im 37/84  soft-tissue]
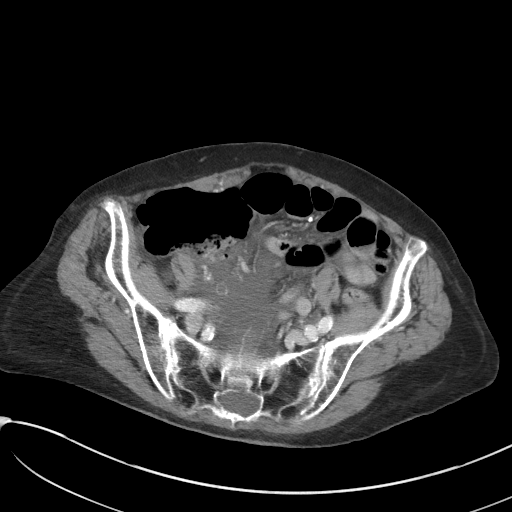
[im 47/84  soft-tissue]
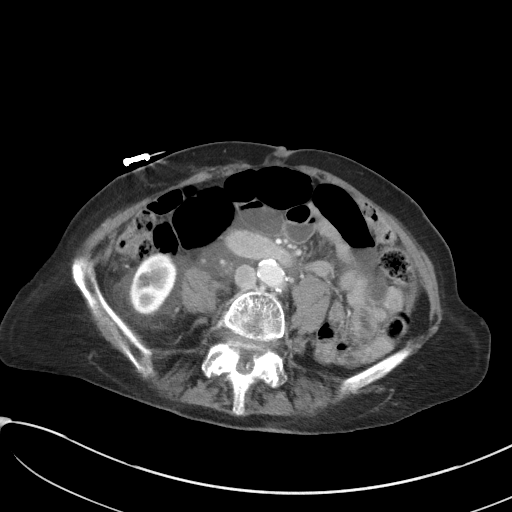
[im 52/84  soft-tissue]
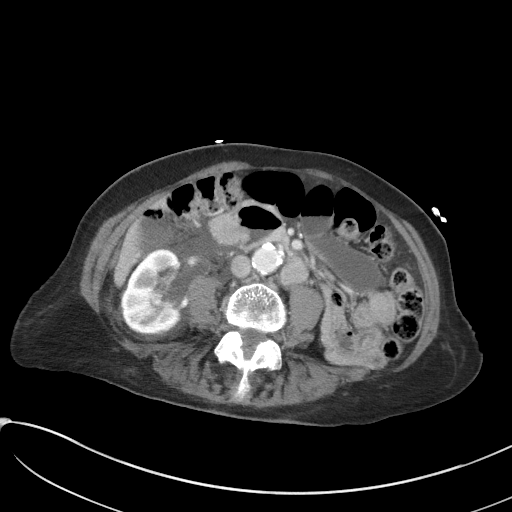
[im 63/84  soft-tissue]
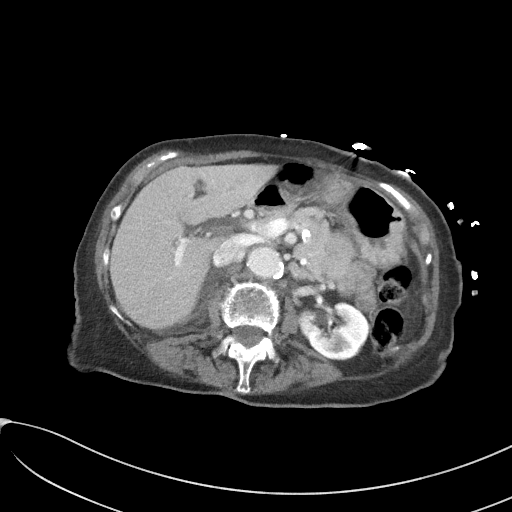
[im 68/84  soft-tissue]
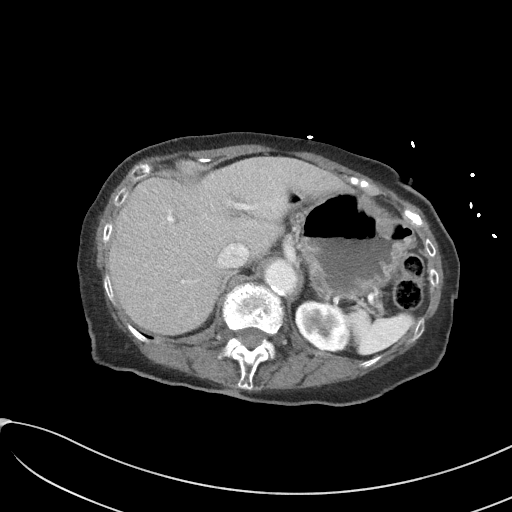
[im 68/84  bone]
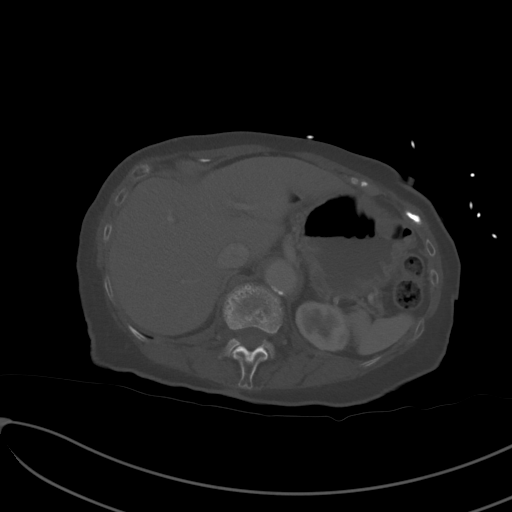
[im 78/84  soft-tissue]
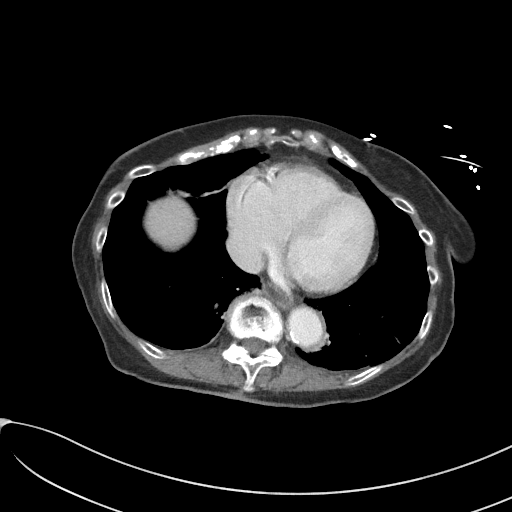

[Series 5: coronal st · coronal · 0.64mm/px · 3 of 85 slices shown]
[im 29/85  soft-tissue]
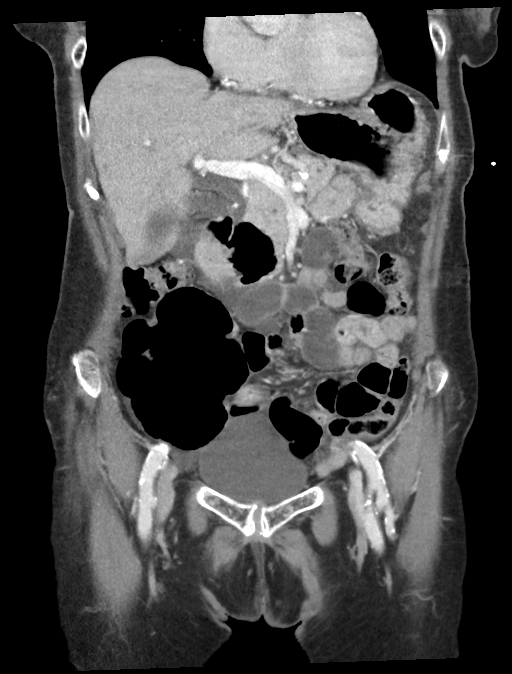
[im 38/85  soft-tissue]
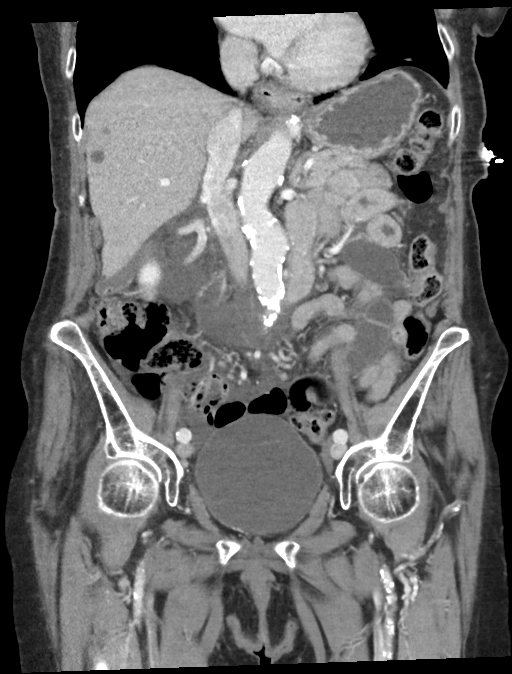
[im 47/85  soft-tissue]
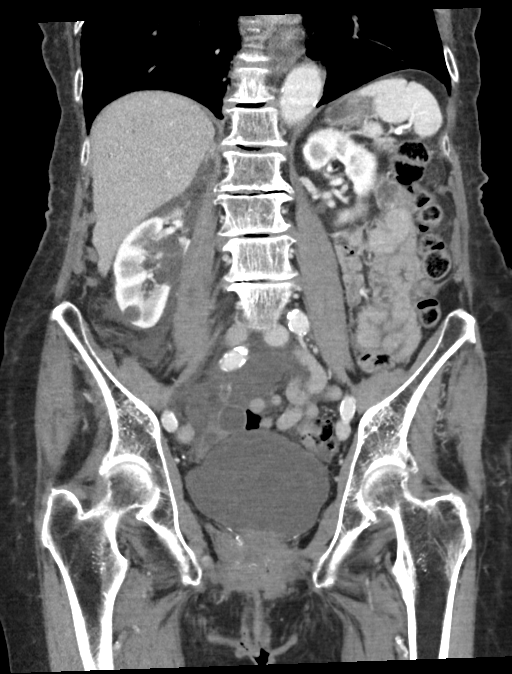

[13 of 46 positions shown; findings below may reference images not displayed]

RADIATION DOSE REDUCTION: This exam was performed according to the
departmental dose-optimization program which includes automated
exposure control, adjustment of the mA and/or kV according to
patient size and/or use of iterative reconstruction technique.

CONTRAST:  75mL OMNIPAQUE IOHEXOL 300 MG/ML  SOLN
FINDINGS: Lower chest: Small linear densities seen in the lower lung fields
suggesting scarring or subsegmental atelectasis. Coronary artery
calcifications are seen.

Hepatobiliary: There are few small low-density lesions in the liver
largest measuring 1.4 cm in diameter. There is no dilation of
intrahepatic bile ducts. Distal common bile duct in the head of the
pancreas measures 9 mm. There is a diverticulum along the inner
margin of second portion of duodenum. There are calcific densities
in the fundus of the gallbladder suggesting gallstones. There is no
wall thickening in gallbladder.

Pancreas: There is prominence of pancreatic duct. No focal
abnormality is seen in the pancreas.

Spleen: Spleen is unremarkable.

Adrenals/Urinary Tract: Adrenals are unremarkable. There is moderate
right hydronephrosis. Right ureter is dilated. In the image 65 of
series 2, there is 4 mm calcific density in the dependent portion of
right side of the urinary bladder. There is no significant wall
thickening in the urinary bladder. There is mild enhancement in the
wall of right ureter. There is slight prominence of collecting
system in the left kidney without dilation left ureter. There is
cm cyst in the lower pole of right kidney. In the delayed images,
there is extravasation of contrast from the pelvocaliceal system in
the right kidney along the anterior aspect of right renal pelvis and
around the proximal course of right ureter. There is filling defect
in the visualized proximal course of right ureter in the delayed
pyelographic phase images. This may be unopacified urine in the
lumen of proximal right ureter. There is right perinephric fluid
collection.

Stomach/Bowel: Small hiatal hernia is seen. There is fluid in the
lower thoracic esophagus lumen suggesting possible gastroesophageal
reflux. Stomach is not distended. There is dilation of small-bowel
loops measuring up to 2.5 cm. There is fluid in the lumen of dilated
small bowel loops. Appendix is not seen distinctly. In the image 38
of coronal reconstruction images there is a small caliber tubular
structure with air in the lumen, possibly appendix. There is no
significant wall thickening in colon. Numerous diverticula are seen
in the colon without signs of focal acute diverticulitis. Part of
the rectum appears to be lower than usual in the left perineum,
possibly suggesting prolapse.

Vascular/Lymphatic: Atherosclerotic plaques and calcifications are
seen in the abdominal aorta and its major branches. There is
ectasiar of infrarenal aorta measuring 2.7 cm.

Reproductive: Uterus is not seen.

Other: Small ascites is seen in the right side of abdomen and
pelvis. There is no pneumoperitoneum.

Musculoskeletal: There is mild anterolisthesis at L4-L5 level.
Degenerative changes are noted in the lumbar spine with disc space
narrowing, bony spurs and facet hypertrophy at multiple levels.
IMPRESSION: There is moderate right hydronephrosis. There is 4 mm calcific
density in the dependent portion of right side of the urinary
bladder. Findings suggest possible calculus at the right
ureterovesical junction or recent passage of right ureteral calculus
into the bladder lumen. There is extravasation of contrast from the
pelvocaliceal system of right kidney with right perinephric fluid
collection, possibly due to caliceal rupture caused by high-grade
obstruction of right ureter. In the delayed images there is
low-density in the lumen of proximal right ureter possibly
unopacified urine or blood clot. Urological consultation should be
considered.

There is no evidence of intestinal obstruction or pneumoperitoneum.
There is dilation of few small bowel loops with fluid in the lumen,
possibly suggesting ileus. Diverticulosis of colon without signs of
focal diverticulitis.

Small ascites is seen in the right side of abdomen and in the pelvic
cavity. Small patchy infiltrates in the lower lung fields may
suggest subsegmental atelectasis or scarring. Coronary artery
calcifications are seen.

There is possible prolapse of segment of rectum into the left
perineal region.

There are few low-density lesions in the liver, possibly cysts or
hemangiomas. Gallbladder stones. Small hiatal hernia with
gastroesophageal reflux.

## 2023-02-26 DIAGNOSIS — L603 Nail dystrophy: Secondary | ICD-10-CM | POA: Diagnosis not present

## 2023-02-26 DIAGNOSIS — L602 Onychogryphosis: Secondary | ICD-10-CM | POA: Diagnosis not present

## 2023-02-26 DIAGNOSIS — L84 Corns and callosities: Secondary | ICD-10-CM | POA: Diagnosis not present

## 2023-02-26 DIAGNOSIS — I739 Peripheral vascular disease, unspecified: Secondary | ICD-10-CM | POA: Diagnosis not present

## 2023-06-13 DIAGNOSIS — R55 Syncope and collapse: Secondary | ICD-10-CM | POA: Diagnosis not present

## 2023-06-13 DIAGNOSIS — C44309 Unspecified malignant neoplasm of skin of other parts of face: Secondary | ICD-10-CM | POA: Diagnosis not present

## 2023-06-28 DIAGNOSIS — L603 Nail dystrophy: Secondary | ICD-10-CM | POA: Diagnosis not present

## 2023-06-28 DIAGNOSIS — L84 Corns and callosities: Secondary | ICD-10-CM | POA: Diagnosis not present

## 2023-06-28 DIAGNOSIS — L602 Onychogryphosis: Secondary | ICD-10-CM | POA: Diagnosis not present

## 2023-06-28 DIAGNOSIS — I739 Peripheral vascular disease, unspecified: Secondary | ICD-10-CM | POA: Diagnosis not present

## 2023-07-11 DIAGNOSIS — I10 Essential (primary) hypertension: Secondary | ICD-10-CM | POA: Diagnosis not present

## 2023-07-11 DIAGNOSIS — J449 Chronic obstructive pulmonary disease, unspecified: Secondary | ICD-10-CM | POA: Diagnosis not present

## 2023-07-11 DIAGNOSIS — R627 Adult failure to thrive: Secondary | ICD-10-CM | POA: Diagnosis not present

## 2023-08-08 DIAGNOSIS — I10 Essential (primary) hypertension: Secondary | ICD-10-CM | POA: Diagnosis not present

## 2023-08-08 DIAGNOSIS — R627 Adult failure to thrive: Secondary | ICD-10-CM | POA: Diagnosis not present

## 2023-08-08 DIAGNOSIS — J449 Chronic obstructive pulmonary disease, unspecified: Secondary | ICD-10-CM | POA: Diagnosis not present

## 2023-08-28 DIAGNOSIS — I739 Peripheral vascular disease, unspecified: Secondary | ICD-10-CM | POA: Diagnosis not present

## 2023-08-28 DIAGNOSIS — L602 Onychogryphosis: Secondary | ICD-10-CM | POA: Diagnosis not present

## 2023-09-07 DIAGNOSIS — R627 Adult failure to thrive: Secondary | ICD-10-CM | POA: Diagnosis not present

## 2023-09-07 DIAGNOSIS — I10 Essential (primary) hypertension: Secondary | ICD-10-CM | POA: Diagnosis not present

## 2023-09-07 DIAGNOSIS — J449 Chronic obstructive pulmonary disease, unspecified: Secondary | ICD-10-CM | POA: Diagnosis not present

## 2023-10-09 DIAGNOSIS — J449 Chronic obstructive pulmonary disease, unspecified: Secondary | ICD-10-CM | POA: Diagnosis not present

## 2023-10-09 DIAGNOSIS — I10 Essential (primary) hypertension: Secondary | ICD-10-CM | POA: Diagnosis not present

## 2023-10-09 DIAGNOSIS — R627 Adult failure to thrive: Secondary | ICD-10-CM | POA: Diagnosis not present
